# Patient Record
Sex: Female | Born: 1970 | Race: White | Hispanic: No | Smoking: Former smoker
Health system: Southern US, Community
[De-identification: ages and names within clinical notes are randomized; demographics above are authoritative.]

## PROBLEM LIST (undated history)

## (undated) DIAGNOSIS — T8859XA Other complications of anesthesia, initial encounter: Secondary | ICD-10-CM

## (undated) DIAGNOSIS — E119 Type 2 diabetes mellitus without complications: Secondary | ICD-10-CM

## (undated) DIAGNOSIS — Z9889 Other specified postprocedural states: Secondary | ICD-10-CM

## (undated) DIAGNOSIS — R51 Headache: Secondary | ICD-10-CM

## (undated) DIAGNOSIS — T4145XA Adverse effect of unspecified anesthetic, initial encounter: Secondary | ICD-10-CM

## (undated) DIAGNOSIS — I10 Essential (primary) hypertension: Secondary | ICD-10-CM

## (undated) DIAGNOSIS — Z973 Presence of spectacles and contact lenses: Secondary | ICD-10-CM

## (undated) DIAGNOSIS — Z8489 Family history of other specified conditions: Secondary | ICD-10-CM

## (undated) DIAGNOSIS — G4730 Sleep apnea, unspecified: Secondary | ICD-10-CM

## (undated) DIAGNOSIS — F419 Anxiety disorder, unspecified: Secondary | ICD-10-CM

## (undated) DIAGNOSIS — K219 Gastro-esophageal reflux disease without esophagitis: Secondary | ICD-10-CM

## (undated) DIAGNOSIS — R519 Headache, unspecified: Secondary | ICD-10-CM

## (undated) DIAGNOSIS — G43909 Migraine, unspecified, not intractable, without status migrainosus: Secondary | ICD-10-CM

## (undated) DIAGNOSIS — R112 Nausea with vomiting, unspecified: Secondary | ICD-10-CM

## (undated) DIAGNOSIS — M549 Dorsalgia, unspecified: Secondary | ICD-10-CM

## (undated) HISTORY — PX: TUBAL LIGATION: SHX77

## (undated) HISTORY — PX: CYSTOSCOPY: SUR368

## (undated) HISTORY — PX: CYSTECTOMY: SUR359

## (undated) HISTORY — DX: Anxiety disorder, unspecified: F41.9

## (undated) HISTORY — PX: CARDIAC CATHETERIZATION: SHX172

## (undated) HISTORY — DX: Dorsalgia, unspecified: M54.9

## (undated) HISTORY — DX: Essential (primary) hypertension: I10

---

## 2004-08-25 ENCOUNTER — Ambulatory Visit: Payer: Self-pay | Admitting: Unknown Physician Specialty

## 2004-08-27 ENCOUNTER — Ambulatory Visit: Payer: Self-pay | Admitting: Unknown Physician Specialty

## 2007-08-17 ENCOUNTER — Ambulatory Visit: Payer: Self-pay

## 2012-03-02 ENCOUNTER — Ambulatory Visit: Payer: Self-pay

## 2012-03-07 ENCOUNTER — Ambulatory Visit: Payer: Self-pay | Admitting: Family Medicine

## 2013-04-10 ENCOUNTER — Ambulatory Visit: Payer: Self-pay | Admitting: Obstetrics and Gynecology

## 2014-05-08 ENCOUNTER — Ambulatory Visit: Payer: Self-pay | Admitting: Obstetrics and Gynecology

## 2014-09-26 DIAGNOSIS — R0789 Other chest pain: Secondary | ICD-10-CM | POA: Insufficient documentation

## 2014-10-18 ENCOUNTER — Ambulatory Visit: Payer: Self-pay | Admitting: Cardiology

## 2014-10-18 LAB — HCG, QUANTITATIVE, PREGNANCY: Beta Hcg, Quant.: <1 m[IU]/mL

## 2014-11-29 ENCOUNTER — Other Ambulatory Visit: Payer: Self-pay | Admitting: Orthopedic Surgery

## 2014-11-29 DIAGNOSIS — M5412 Radiculopathy, cervical region: Secondary | ICD-10-CM

## 2014-12-11 ENCOUNTER — Ambulatory Visit
Admission: RE | Admit: 2014-12-11 | Discharge: 2014-12-11 | Disposition: A | Discharge status: Home / Self Care | Payer: BLUE CROSS/BLUE SHIELD | Source: Ambulatory Visit | Attending: Orthopedic Surgery | Admitting: Orthopedic Surgery

## 2014-12-11 DIAGNOSIS — M5412 Radiculopathy, cervical region: Secondary | ICD-10-CM | POA: Diagnosis present

## 2014-12-11 DIAGNOSIS — M4722 Other spondylosis with radiculopathy, cervical region: Secondary | ICD-10-CM | POA: Insufficient documentation

## 2014-12-12 ENCOUNTER — Ambulatory Visit (INDEPENDENT_AMBULATORY_CARE_PROVIDER_SITE_OTHER): Payer: BLUE CROSS/BLUE SHIELD

## 2014-12-12 ENCOUNTER — Ambulatory Visit (INDEPENDENT_AMBULATORY_CARE_PROVIDER_SITE_OTHER): Payer: BLUE CROSS/BLUE SHIELD | Admitting: Podiatry

## 2014-12-12 ENCOUNTER — Encounter: Payer: Self-pay | Admitting: Podiatry

## 2014-12-12 ENCOUNTER — Other Ambulatory Visit: Payer: Self-pay | Admitting: *Deleted

## 2014-12-12 VITALS — BP 124/88 | HR 102 | Resp 16 | Ht 67.0 in | Wt 200.0 lb

## 2014-12-12 DIAGNOSIS — M722 Plantar fascial fibromatosis: Secondary | ICD-10-CM

## 2014-12-12 DIAGNOSIS — E669 Obesity, unspecified: Secondary | ICD-10-CM | POA: Insufficient documentation

## 2014-12-12 DIAGNOSIS — E559 Vitamin D deficiency, unspecified: Secondary | ICD-10-CM | POA: Insufficient documentation

## 2014-12-12 DIAGNOSIS — F419 Anxiety disorder, unspecified: Secondary | ICD-10-CM | POA: Insufficient documentation

## 2014-12-12 DIAGNOSIS — R7303 Prediabetes: Secondary | ICD-10-CM | POA: Insufficient documentation

## 2014-12-12 MED ORDER — TRIAMCINOLONE ACETONIDE 10 MG/ML IJ SUSP
10.0000 mg | Freq: Once | INTRAMUSCULAR | Status: AC
  Administered 2014-12-12: 10 mg

## 2014-12-12 NOTE — Patient Instructions (Signed)

## 2014-12-12 NOTE — Progress Notes (Signed)
Subjective:     Patient ID: Julia Nunez, female   DOB: 1970/11/11, 44 y.o.   MRN: [id number]  HPI 44 year old female presents the office today with complaints of bilateral heel pain, plantar fasciitis. She states that she has been diagnosed with plantar fasciitis for which she was previous he seen another podiatrist for several years ago. She states that she's had pain in her heel for approximate 5 years. She states that she's had undergone a series of injections, orthotics, stretching, bracing. She states that after the injections and treatment she does have resolution however after. Time the pain discomfort back. She states that her last time she is seen today for this was approximate one year ago. She states that she has pain to her heels been taken the morning or after periods of inactivity which is relieved by ambulation. She denies any numbness or tingling. Denies E swelling or redness. Denies any history of injury or trauma or any change or increase activity the time of onset of symptoms. No other complaints at this time.   Review of Systems  All other systems reviewed and are negative.      Objective:   Physical Exam AAO x3, NAD DP/PT pulses palpable bilaterally, CRT less than 3 seconds Protective sensation intact with Simms Weinstein monofilament, vibratory sensation intact, Achilles tendon reflex intact Tenderness to palpation overlying the plantar medial tubercle of the calcaneus to bilateral heels at the insertion of the plantar fascia. There is no pain along the course of plantar fascia within the arch of the foot. There is no pain with lateral compression of the calcaneus or pain the vibratory sensation. No pain on the posterior aspect of the calcaneus or along the course/insertion of the Achilles tendon. There is no overlying edema, erythema, increase in warmth. No other areas of tenderness palpation or pain with vibratory sensation to the foot/ankle. MMT 5/5, ROM WNL No  open lesions or pre-ulcerative lesions are identified. No pain with calf compression, swelling, warmth, erythema.     Assessment:     44 year old female with bilateral heel pain, plantar fasciitis.     Plan:     -X-rays were obtained and reviewed with the patient. -Treatment options were discussed including alternatives, risks, complications. -Patient elects to proceed with steroid injection into the bilateral heels. Under sterile skin preparation, a total of 2.5cc of kenalog 10, 0.5% Marcaine plain, and 2% lidocaine plain were infiltrated into the symptomatic area without complication. A band-aid was applied. Patient tolerated the injection well without complication. Post-injection care with discussed with the patient. Discussed with the patient to ice the area over the next couple of days to help prevent a steroid flare.  -Dispensed bilateral plantar fascial braces -Night splint -Ice to the area -Stretching exercises -Discussed shoe gear modifications and to wear supportive shoe at all times. Recommended not to go barefoot even while at home. Discussed orthotics. -Follow-up in 3 weeks or sooner if any problems are to arise. Call with any questions or concerns or any change in symptoms in the meantime.

## 2014-12-18 ENCOUNTER — Other Ambulatory Visit: Payer: Self-pay | Admitting: Obstetrics and Gynecology

## 2014-12-18 DIAGNOSIS — Z1231 Encounter for screening mammogram for malignant neoplasm of breast: Secondary | ICD-10-CM

## 2014-12-19 LAB — HM PAP SMEAR: HM Pap smear: NEGATIVE

## 2014-12-25 ENCOUNTER — Ambulatory Visit: Payer: Self-pay | Admitting: Surgery

## 2015-01-01 ENCOUNTER — Ambulatory Visit (INDEPENDENT_AMBULATORY_CARE_PROVIDER_SITE_OTHER): Payer: BLUE CROSS/BLUE SHIELD | Admitting: Surgery

## 2015-01-01 ENCOUNTER — Encounter: Payer: Self-pay | Admitting: Surgery

## 2015-01-01 VITALS — BP 137/85 | HR 87 | Temp 98.5°F | Resp 20 | Ht 67.0 in | Wt 216.0 lb

## 2015-01-01 DIAGNOSIS — L723 Sebaceous cyst: Secondary | ICD-10-CM | POA: Diagnosis not present

## 2015-01-01 MED ORDER — OXYCODONE-ACETAMINOPHEN 5-325 MG PO TABS: 1.0000 | ORAL_TABLET | Freq: Three times a day (TID) | ORAL | Status: DC | PRN

## 2015-01-01 MED ORDER — HYDROCODONE-ACETAMINOPHEN 5-325 MG PO TABS: 1.0000 | ORAL_TABLET | Freq: Three times a day (TID) | ORAL | Status: DC | PRN

## 2015-01-01 NOTE — Patient Instructions (Signed)
Call if you have increased pain, redness/swelling from sebaceous cysts

## 2015-01-01 NOTE — Progress Notes (Signed)
44 yo with history of multiple sebaceous cysts on scalp which have been excised in past who presents with multiple, tender sebaceous cysts.  All chronic.  No redness/drainage.  No fluctuance.  No fevers/chills, night sweats, shortness of breath, chest pain, cough, abdominal pain, nausea/vomiting, diarrhea/constipation, dysuria/hematuria.  Active Ambulatory Problems    Diagnosis Date Noted  . Anxiety 12/12/2014  . Atypical chest pain 09/26/2014  . Borderline diabetes 12/12/2014  . Adiposity 12/12/2014  . Avitaminosis D 12/12/2014  . Sebaceous cyst 01/01/2015   Resolved Ambulatory Problems    Diagnosis Date Noted  . No Resolved Ambulatory Problems   Past Medical History  Diagnosis Date  . Hypertension   . Back pain    History   Social History  . Marital Status: Single    Spouse Name: N/A  . Number of Children: N/A  . Years of Education: N/A   Occupational History  . Not on file.   Social History Main Topics  . Smoking status: Former Research scientist (life sciences)  . Smokeless tobacco: Never Used  . Alcohol Use: 0.0 oz/week    0 Standard drinks or equivalent per week     Comment: OCCASIONALLY  . Drug Use: No  . Sexual Activity: Not on file   Other Topics Concern  . Not on file   Social History Narrative   Allergies  Allergen Reactions  . Hydrocodone-Acetaminophen Nausea Only   Family History  Problem Relation Age of Onset  . Hypertension Father   . Diabetes Father    Blood pressure 137/85, pulse 87, temperature 98.5 F (36.9 C), temperature source Oral, resp. rate 20, height 5\' 7"  (1.702 m), weight 216 lb (97.977 kg), last menstrual period 12/25/2014. GEN: NAD/A&Ox3 FACE: no obvious facial trauma, normal external nose, normal external ears EYES: no scleral icterus, no conjunctivitis HEAD: normocephalic atraumatic, multiple subcentimeter sebaceous cysts on scalp without erythema/fluctuance, drainage CV: RRR, no MRG RESP: moving air well, lungs clear ABD: soft, nontender,  nondistended EXT: moving all ext well, strength 5/5 NEURO: cnII-XII grossly intact, sensation intact all 4 ext  Labs: None  Imaging: None  A/P  44 yo with multiple sebaceous cysts, will plan on excision on 6/22.  Given Rx for percocet to take before procedure.  I have spoken with patient and she would like to proceed as planned.

## 2015-01-02 ENCOUNTER — Encounter: Payer: Self-pay | Admitting: Podiatry

## 2015-01-02 ENCOUNTER — Ambulatory Visit (INDEPENDENT_AMBULATORY_CARE_PROVIDER_SITE_OTHER): Payer: BLUE CROSS/BLUE SHIELD | Admitting: Podiatry

## 2015-01-02 VITALS — BP 112/80 | HR 92 | Resp 16

## 2015-01-02 DIAGNOSIS — M722 Plantar fascial fibromatosis: Secondary | ICD-10-CM

## 2015-01-02 MED ORDER — TRIAMCINOLONE ACETONIDE 10 MG/ML IJ SUSP: 10.0000 mg | Freq: Once | INTRAMUSCULAR | Status: DC

## 2015-01-07 NOTE — Progress Notes (Signed)
Patient ID: Julia Nunez, female   DOB: 1971-02-20, 44 y.o.   MRN: [id number]  Subjective: 44 year old female presents the office they for follow-up evaluation of bilateral heel pain, plantar fasciitis. She states that overall she is doing better than what she was compared to last appointment all as she does continue to have some discomfort to her heel. She's been continuing with the stretching, icing activities on a consistent basis as well as wearing the plantar fascial braces. She had no complications after the injection last appointment. She denies any numbness or tingling in the pain does not wake her up at night. Denies any swelling or redness. No acute changes since last appointment and no other complaints at this time.  Objective: AAO x3, NAD DP/PT pulses palpable bilaterally, CRT less than 3 seconds Protective sensation intact with Simms Weinstein monofilament, vibratory sensation intact, Achilles tendon reflex intact There is continued mild tenderness to palpation overlying the plantar medial tubercle of the calcaneus to bilateral heels at the insertion of the plantar fascia. There is no pain along the course of plantar fascial within the arch of the foot and the plantar fascia appears intact. There is no pain with lateral compression of the calcaneus or pain the vibratory sensation. No pain on the posterior aspect of the calcaneus or along the course/insertion of the Achilles tendon. There is no overlying edema, erythema, increase in warmth. No other areas of tenderness palpation or pain with vibratory sensation to the foot/ankle. MMT 5/5, ROM WNL No open lesions or pre-ulcerative lesions are identified. No pain with calf compression, swelling, warmth, erythema.  Assessment: 44 year old female with bilateral heel pain, plantar fasciitis; improving  Plan: -Treatment options discussed including all alternatives, risks, and complications Patient elects to proceed with steroid injection  into the bilateral heels. Under sterile skin preparation, a total of 2.5cc of kenalog 10, 0.5% Marcaine plain, and 2% lidocaine plain were infiltrated into the symptomatic area without complication. A band-aid was applied. Patient tolerated the injection well without complication. Post-injection care with discussed with the patient. Discussed with the patient to ice the area over the next couple of days to help prevent a steroid flare.  -Continue plantar fascial brace. -Ice to the area -Stretching activities on a consistent basis. -Shoe gear modifications and discuss orthotics again. -Follow-up in 3 weeks or sooner if any problems are to arise. In the meantime I encouraged her to call the office with any questions, concerns, change in symptoms.

## 2015-01-15 ENCOUNTER — Ambulatory Visit: Payer: Self-pay | Admitting: Surgery

## 2015-01-23 ENCOUNTER — Encounter: Payer: Self-pay | Admitting: Podiatry

## 2015-01-23 ENCOUNTER — Ambulatory Visit (INDEPENDENT_AMBULATORY_CARE_PROVIDER_SITE_OTHER): Payer: BLUE CROSS/BLUE SHIELD | Admitting: Podiatry

## 2015-01-23 VITALS — BP 121/79 | HR 97 | Resp 18

## 2015-01-23 DIAGNOSIS — M722 Plantar fascial fibromatosis: Secondary | ICD-10-CM | POA: Diagnosis not present

## 2015-01-23 NOTE — Progress Notes (Signed)
Patient ID: Julia Nunez, female   DOB: [birth year], 44 y.o.   MRN: [id number]  Subjective: 44 year old female presents the office they for follow-up evaluation of bilateral heel pain, plantar fasciitis. She states his last appointment she is tentatively improved compared to what she was a last appointment. She has continued some mild intermittent discomfort to the bottom of her heel however is not on a consistent basis. She denies a numbness routinely denies any swelling or redness. The pain does not wake her up at night. She'll continue with stretching, icing to become a consistent basis as well as the night splint and the plantar fascial brace. No other complaints at this time in no acute changes since last appointment. Denies any systemic complaints as fevers, chills, nausea, vomiting.  Objective: AAO x3, NAD DP/PT pulses palpable bilaterally, CRT less than 3 seconds Protective sensation intact with Simms Weinstein monofilament, vibratory sensation intact, Achilles tendon reflex intact There is significantly decreased tenderness to palpation overlying the plantar medial tubercle of the calcaneus to bilateral heels at the insertion of the plantar fascia. There is no pain along the course of plantar fascia within the arch of the foot. And the plantar fascia appears intact There is no pain with lateral compression of the calcaneus or pain the vibratory sensation. No pain on the posterior aspect of the calcaneus or along the course/insertion of the Achilles tendon. There is no overlying edema, erythema, increase in warmth. No other areas of tenderness palpation or pain with vibratory sensation to the foot/ankle. MMT 5/5, ROM WNL No open lesions or pre-ulcerative lesions are identified. No pain with calf compression, swelling, warmth, erythema.   Assessment: 44 year old female with resolving bilateral plantar fasciitis  Plan: -Treatment options discussed including all alternatives, risks, and  complications -Will hold off on a third steroid injection as her pain is substantially improved.  -Discussed CMO and she would like to proceed with this. She was scanned for orthotics and they were sent to [facility] -Continue stretching and icing on a consistent basis.  -Discussed shoegear modifications -Follow-up 4 weeks or sooner if any problems arise. In the meantime, encouraged to call the office with any questions, concerns, change in symptoms.  -Orthotic pickup next appt.   Celesta Gentile, DPM

## 2015-02-05 ENCOUNTER — Ambulatory Visit (INDEPENDENT_AMBULATORY_CARE_PROVIDER_SITE_OTHER): Payer: BLUE CROSS/BLUE SHIELD | Admitting: Surgery

## 2015-02-05 ENCOUNTER — Encounter: Payer: Self-pay | Admitting: Surgery

## 2015-02-05 VITALS — BP 131/90 | HR 96 | Temp 99.4°F | Resp 20

## 2015-02-05 DIAGNOSIS — L723 Sebaceous cyst: Secondary | ICD-10-CM

## 2015-02-06 NOTE — Progress Notes (Signed)
CC: Multiple sebaceous cyst  HPI: 44 yo F who presents for follow up for excision of multiple scalp sebaceous cysts.  Have seen her previously.  Are a nuisance more than anything.  No redness/tenderness.  No fevers/chills, night sweats, shortness of breath, cough, chest pain, nausea/vomiting, diarrhea/constipation, dysuria/hematuria.  Active Ambulatory Problems    Diagnosis Date Noted  . Anxiety 12/12/2014  . Atypical chest pain 09/26/2014  . Borderline diabetes 12/12/2014  . Adiposity 12/12/2014  . Avitaminosis D 12/12/2014  . Sebaceous cyst 01/01/2015   Resolved Ambulatory Problems    Diagnosis Date Noted  . No Resolved Ambulatory Problems   Past Medical History  Diagnosis Date  . Hypertension   . Back pain    Family History  Problem Relation Age of Onset  . Hypertension Father   . Diabetes Father    History   Social History  . Marital Status: Single    Spouse Name: N/A  . Number of Children: N/A  . Years of Education: N/A   Occupational History  . Not on file.   Social History Main Topics  . Smoking status: Former Research scientist (life sciences)  . Smokeless tobacco: Never Used  . Alcohol Use: 0.0 oz/week    0 Standard drinks or equivalent per week     Comment: OCCASIONALLY  . Drug Use: No  . Sexual Activity: Not on file   Other Topics Concern  . Not on file   Social History Narrative     Medication List       This list is accurate as of: 02/05/15 11:59 PM.  Always use your most recent med list.               ALPRAZolam 0.5 MG tablet  Commonly known as:  XANAX     buPROPion 150 MG 24 hr tablet  Commonly known as:  WELLBUTRIN XL     ciprofloxacin 500 MG tablet  Commonly known as:  CIPRO     fluconazole 150 MG tablet  Commonly known as:  DIFLUCAN     hydrochlorothiazide 25 MG tablet  Commonly known as:  HYDRODIURIL     meloxicam 15 MG tablet  Commonly known as:  MOBIC     methocarbamol 500 MG tablet  Commonly known as:  ROBAXIN  Take by mouth.     NEXIUM 40  MG capsule  Generic drug:  esomeprazole  Take by mouth.     oxyCODONE 5 MG immediate release tablet  Commonly known as:  Oxy IR/ROXICODONE  Take by mouth.     oxyCODONE-acetaminophen 5-325 MG per tablet  Commonly known as:  PERCOCET/ROXICET  Take 1 tablet by mouth every 8 (eight) hours as needed for severe pain. Take 1-2 tabs prior to procedure on 6/22     polyethylene glycol powder powder  Commonly known as:  GLYCOLAX/MIRALAX     predniSONE 10 MG tablet  Commonly known as:  DELTASONE     tiZANidine 4 MG tablet  Commonly known as:  ZANAFLEX     Vitamin D (Ergocalciferol) 50000 UNITS Caps capsule  Commonly known as:  DRISDOL     zolpidem 12.5 MG CR tablet  Commonly known as:  AMBIEN CR       ROS: Full ROS obtained, pertinent positives and negatives as above  Blood pressure 131/90, pulse 96, temperature 99.4 F (37.4 C), temperature source Oral, resp. rate 20. GEN:NAD/A&Ox3 SCALP: multiple sebaceous cysts, largest approx 1cm diameter, on scalp  Imaging: None Labs: None  Procedure performed: Informed consent obtained.  Scalp area with sebaceous cyst shaved, prepped and draped.  Timeout performed.  Pericystic area infiltrated with 1% lidocaine with epinephrine.  Longitudinal incision made down to cyst.  Cyst dissected out en toto.  Wound irrigated and closed with simple 4-0 vicryl interrupted suture.  Procedure repeated for 4 additional cysts.  After all excised, patient drapes taken down.  No immediate complications.  Needle, sponge and instrument count correct at end of procedure.

## 2015-02-06 NOTE — Patient Instructions (Signed)
Do not drive on pain medications °Call or return to ER if you develop fever greater than 101.5, nausea/vomiting, increased pain, redness/drainage from incisions ° ° °

## 2015-02-12 ENCOUNTER — Ambulatory Visit: Payer: BLUE CROSS/BLUE SHIELD | Admitting: Surgery

## 2015-02-12 ENCOUNTER — Encounter: Payer: Self-pay | Admitting: Surgery

## 2015-02-12 ENCOUNTER — Ambulatory Visit (INDEPENDENT_AMBULATORY_CARE_PROVIDER_SITE_OTHER): Payer: BLUE CROSS/BLUE SHIELD | Admitting: Surgery

## 2015-02-12 VITALS — BP 130/81 | HR 97 | Temp 98.5°F | Ht 67.0 in | Wt 217.0 lb

## 2015-02-12 DIAGNOSIS — L723 Sebaceous cyst: Secondary | ICD-10-CM

## 2015-02-12 NOTE — Patient Instructions (Signed)
Please give [place] a call if you need [place].

## 2015-02-13 NOTE — Progress Notes (Signed)
She is here today for suture removal. Sutures were removed. All 5 incision sites on her scalp are healing nicely.  See her back in the office as needed.

## 2015-02-20 ENCOUNTER — Ambulatory Visit (INDEPENDENT_AMBULATORY_CARE_PROVIDER_SITE_OTHER): Payer: BLUE CROSS/BLUE SHIELD | Admitting: Podiatry

## 2015-02-20 ENCOUNTER — Encounter: Payer: Self-pay | Admitting: Podiatry

## 2015-02-20 DIAGNOSIS — M722 Plantar fascial fibromatosis: Secondary | ICD-10-CM | POA: Diagnosis not present

## 2015-02-21 NOTE — Progress Notes (Signed)
Patient ID: Julia Nunez, female   DOB: 1970/09/12, 44 y.o.   MRN: [id number]  Subjective: 44 year old female presents the office they for follow-up evaluation of bilateral heel pain, plantar fasciitis and to pick up her inserts.  She has continue with stretching, icing activities as well as the plantar fascial braces. She states that her pain significantly improved compared to what it was previously. She does get some mild intermittent discomfort at times however is not significant. Denies any swelling or redness. Denies numbness or tingling. No other complaints at this time.  Objective: AAO x3, NAD DP/PT pulses palpable bilaterally, CRT less than 3 seconds Protective sensation intact with Simms Weinstein monofilament, vibratory sensation intact, Achilles tendon reflex intact There is slight tenderness to palpation overlying the plantar medial tubercle of the calcaneus to bilateral heels at the insertion of the plantar fascia although it is greatly decreased. There is no pain along the course of plantar fascia within the arch of the foot. And the plantar fascia appears intact There is no pain with lateral compression of the calcaneus or pain the vibratory sensation. No pain on the posterior aspect of the calcaneus or along the course/insertion of the Achilles tendon. There is no overlying edema, erythema, increase in warmth. No other areas of tenderness palpation or pain with vibratory sensation to the foot/ankle. MMT 5/5, ROM WNL No open lesions or pre-ulcerative lesions are identified. No pain with calf compression, swelling, warmth, erythema.   Assessment: 44 year old female with resolving bilateral plantar fasciitis  Plan: -Treatment options discussed including all alternatives, risks, and complications - At today's appointment she picked up orthotics. She tried them on and they were trimmed to fit into her shoe. Break in instructions were discussed the patient. -Continue stretching and  icing on a consistent basis.  -Discussed shoegear modifications -Follow-up 4 weeks or sooner if any problems arise. In the meantime, encouraged to call the office with any questions, concerns, change in symptoms.   Celesta Gentile, DPM

## 2015-03-18 ENCOUNTER — Encounter: Payer: Self-pay | Admitting: Podiatry

## 2015-03-18 ENCOUNTER — Ambulatory Visit (INDEPENDENT_AMBULATORY_CARE_PROVIDER_SITE_OTHER): Payer: BLUE CROSS/BLUE SHIELD | Admitting: Podiatry

## 2015-03-18 VITALS — BP 130/91 | HR 101 | Resp 18

## 2015-03-18 DIAGNOSIS — M722 Plantar fascial fibromatosis: Secondary | ICD-10-CM

## 2015-03-19 NOTE — Progress Notes (Signed)
Patient ID: Julia Nunez, female   DOB: 02/17/1971, 44 y.o.   MRN: [id number]  Subjective: 44 year old female presents the office today for follow-up evaluation after receiving orthotics in for plantar fasciitis. She states that she has gotten  Adjusted to the orthotics and she is not wearing them all and time. She states that she notices significant difference when she does  Wear them. She has occasional discomfort to her heels although she states it is significantly improved. No other complaints at this time. No acute changes.  Objective: AAO x3, NAD DP/PT pulses palpable bilaterally, CRT less than 3 seconds Protective sensation intact with Simms Weinstein monofilament, vibratory sensation intact, Achilles tendon reflex intact There is currently no tenderness to palpation overlying the plantar medial tubercle of the calcaneus to bilateral heel at the insertion of the plantar fascia. There is no pain along the course of plantar fascia within the arch of the foot. There is no pain with lateral compression of the calcaneus or pain the vibratory sensation. No pain on the posterior aspect of the calcaneus or along the course/insertion of the Achilles tendon. There is no overlying edema, erythema, increase in warmth. No other areas of tenderness palpation or pain with vibratory sensation to the foot/ankle.  No open lesions or pre-ulcerative lesions are identified. No pain with calf compression, swelling, warmth, erythema.  Assessment:  44 year old female with resolving heel pain , plantar fasciitis   Plan: -Treatment options discussed including all alternatives, risks, and complications -Recommended continue with stretching, icing activities on a consistent basis until she has no pain. Also continue with orthotics. Continue wear supportive shoe at all times leaving the house. Follow-up in 4 weeks if his symptoms are not completely resolved or sooner if any problems are to arise. In the meantime I  encouraged her to call the office with any questions, concerns, change in symptoms.  Celesta Gentile, DPM

## 2015-03-27 ENCOUNTER — Encounter: Payer: Self-pay | Admitting: *Deleted

## 2015-03-27 ENCOUNTER — Ambulatory Visit: Payer: BLUE CROSS/BLUE SHIELD | Admitting: Certified Registered Nurse Anesthetist

## 2015-03-27 ENCOUNTER — Ambulatory Visit
Admission: RE | Admit: 2015-03-27 | Discharge: 2015-03-27 | Disposition: A | Discharge status: Home / Self Care | Payer: BLUE CROSS/BLUE SHIELD | Source: Ambulatory Visit | Attending: Unknown Physician Specialty | Admitting: Unknown Physician Specialty

## 2015-03-27 ENCOUNTER — Encounter
Admission: RE | Disposition: A | Discharge status: Home / Self Care | Payer: Self-pay | Source: Ambulatory Visit | Attending: Unknown Physician Specialty

## 2015-03-27 DIAGNOSIS — K640 First degree hemorrhoids: Secondary | ICD-10-CM | POA: Diagnosis not present

## 2015-03-27 DIAGNOSIS — K317 Polyp of stomach and duodenum: Secondary | ICD-10-CM | POA: Insufficient documentation

## 2015-03-27 DIAGNOSIS — Z86010 Personal history of colonic polyps: Secondary | ICD-10-CM | POA: Insufficient documentation

## 2015-03-27 DIAGNOSIS — I252 Old myocardial infarction: Secondary | ICD-10-CM | POA: Diagnosis not present

## 2015-03-27 DIAGNOSIS — Z09 Encounter for follow-up examination after completed treatment for conditions other than malignant neoplasm: Secondary | ICD-10-CM | POA: Diagnosis present

## 2015-03-27 DIAGNOSIS — Z886 Allergy status to analgesic agent status: Secondary | ICD-10-CM | POA: Insufficient documentation

## 2015-03-27 DIAGNOSIS — I2510 Atherosclerotic heart disease of native coronary artery without angina pectoris: Secondary | ICD-10-CM | POA: Insufficient documentation

## 2015-03-27 DIAGNOSIS — D127 Benign neoplasm of rectosigmoid junction: Secondary | ICD-10-CM | POA: Insufficient documentation

## 2015-03-27 DIAGNOSIS — Z79899 Other long term (current) drug therapy: Secondary | ICD-10-CM | POA: Insufficient documentation

## 2015-03-27 DIAGNOSIS — K219 Gastro-esophageal reflux disease without esophagitis: Secondary | ICD-10-CM | POA: Diagnosis not present

## 2015-03-27 DIAGNOSIS — F419 Anxiety disorder, unspecified: Secondary | ICD-10-CM | POA: Insufficient documentation

## 2015-03-27 DIAGNOSIS — D125 Benign neoplasm of sigmoid colon: Secondary | ICD-10-CM | POA: Insufficient documentation

## 2015-03-27 DIAGNOSIS — Z87891 Personal history of nicotine dependence: Secondary | ICD-10-CM | POA: Insufficient documentation

## 2015-03-27 DIAGNOSIS — I10 Essential (primary) hypertension: Secondary | ICD-10-CM | POA: Insufficient documentation

## 2015-03-27 HISTORY — PX: COLONOSCOPY WITH PROPOFOL: SHX5780

## 2015-03-27 HISTORY — PX: SAVORY DILATION: SHX5439

## 2015-03-27 HISTORY — PX: ESOPHAGOGASTRODUODENOSCOPY: SHX5428

## 2015-03-27 SURGERY — EGD (ESOPHAGOGASTRODUODENOSCOPY)
Anesthesia: General

## 2015-03-27 MED ORDER — MIDAZOLAM HCL 5 MG/5ML IJ SOLN
INTRAMUSCULAR | Status: DC | PRN
  Administered 2015-03-27: 1 mg via INTRAVENOUS

## 2015-03-27 MED ORDER — FENTANYL CITRATE (PF) 100 MCG/2ML IJ SOLN
INTRAMUSCULAR | Status: DC | PRN
Start: 2015-03-27 — End: 2015-03-27
  Administered 2015-03-27: 50 ug via INTRAVENOUS

## 2015-03-27 MED ORDER — PROPOFOL 10 MG/ML IV BOLUS
INTRAVENOUS | Status: DC | PRN
  Administered 2015-03-27: 50 mg via INTRAVENOUS

## 2015-03-27 MED ORDER — SODIUM CHLORIDE 0.9 % IV SOLN: INTRAVENOUS | Status: DC

## 2015-03-27 MED ORDER — PROPOFOL INFUSION 10 MG/ML OPTIME
INTRAVENOUS | Status: DC | PRN
  Administered 2015-03-27: 180 ug/kg/min via INTRAVENOUS

## 2015-03-27 MED ORDER — SODIUM CHLORIDE 0.9 % IV SOLN
INTRAVENOUS | Status: DC
  Administered 2015-03-27: 1000 mL via INTRAVENOUS

## 2015-03-27 MED ORDER — LIDOCAINE HCL (CARDIAC) 20 MG/ML IV SOLN
INTRAVENOUS | Status: DC | PRN
  Administered 2015-03-27: 100 mg via INTRAVENOUS

## 2015-03-27 MED ORDER — GLYCOPYRROLATE 0.2 MG/ML IJ SOLN
INTRAMUSCULAR | Status: DC | PRN
  Administered 2015-03-27: 0.3 mg via INTRAVENOUS

## 2015-03-27 NOTE — H&P (Signed)
Primary Care Physician:  [facility], MD Primary Gastroenterologist:  Dr. Vira Agar  Pre-Procedure History & Physical: HPI:  Julia Nunez is a 44 y.o. female is here for a colonoscopy and upper endoscopy    Past Medical History  Diagnosis Date  . Anxiety   . Hypertension   . Back pain     Past Surgical History  Procedure Laterality Date  . Cesarean section  1999    X1  . Cystectomy      SEBACEOUS CYST ON SCALP    Prior to Admission medications   Medication Sig Start Date End Date Taking? Authorizing Provider  ALPRAZolam Duanne Moron) 0.5 MG tablet  12/09/14  Yes Historical Provider, MD  buPROPion (WELLBUTRIN XL) 150 MG 24 hr tablet  11/07/14  Yes Historical Provider, MD  clobetasol cream (TEMOVATE) 0.05 %  02/24/15  Yes Historical Provider, MD  esomeprazole (NEXIUM) 40 MG capsule Take by mouth. 01/22/15  Yes Historical Provider, MD  hydrochlorothiazide (HYDRODIURIL) 25 MG tablet  10/30/14  Yes Historical Provider, MD  ciprofloxacin (CIPRO) 500 MG tablet  12/03/14   Historical Provider, MD  fluconazole (DIFLUCAN) 150 MG tablet  12/03/14   Historical Provider, MD  meloxicam (MOBIC) 15 MG tablet Take by mouth. 02/17/15   Historical Provider, MD  methocarbamol (ROBAXIN) 500 MG tablet Take by mouth. 01/15/15   Historical Provider, MD  oxyCODONE (OXY IR/ROXICODONE) 5 MG immediate release tablet Take by mouth. 01/30/15   Historical Provider, MD  oxyCODONE-acetaminophen (PERCOCET/ROXICET) 5-325 MG per tablet Take 1 tablet by mouth every 8 (eight) hours as needed for severe pain. Take 1-2 tabs prior to procedure on 6/22 Patient not taking: Reported on 03/27/2015 01/01/15   Marlyce Huge, MD  polyethylene glycol powder (GLYCOLAX/MIRALAX) powder  01/22/15   Historical Provider, MD  predniSONE (DELTASONE) 10 MG tablet  10/04/14   Historical Provider, MD  tiZANidine (ZANAFLEX) 4 MG tablet  09/18/14   Historical Provider, MD  Vitamin D, Ergocalciferol, (DRISDOL) 50000 UNITS CAPS capsule  12/09/14    Historical Provider, MD  zolpidem (AMBIEN CR) 12.5 MG CR tablet  11/07/14   Historical Provider, MD    Allergies as of 01/23/2015 - Review Complete 01/23/2015  Allergen Reaction Noted  . Hydrocodone-acetaminophen Nausea Only 12/12/2014    Family History  Problem Relation Age of Onset  . Hypertension Father   . Diabetes Father     Social History   Social History  . Marital Status: Single    Spouse Name: N/A  . Number of Children: N/A  . Years of Education: N/A   Occupational History  . Not on file.   Social History Main Topics  . Smoking status: Former Research scientist (life sciences)  . Smokeless tobacco: Never Used  . Alcohol Use: 0.0 oz/week    0 Standard drinks or equivalent per week     Comment: OCCASIONALLY  . Drug Use: No  . Sexual Activity: Yes    Birth Control/ Protection: IUD   Other Topics Concern  . Not on file   Social History Narrative    Review of Systems: See HPI, otherwise negative ROS  Physical Exam: BP 118/80 mmHg  Pulse 88  Temp(Src) 98.5 F (36.9 C) (Oral)  Resp 18  Ht 5\' 7"  (1.702 m)  Wt 95.255 kg (210 lb)  BMI 32.88 kg/m2  SpO2 100%  LMP 03/09/2015 General:   Alert,  pleasant and cooperative in NAD Head:  Normocephalic and atraumatic. Neck:  Supple; no masses or thyromegaly. Lungs:  Clear throughout to auscultation.  Heart:  Regular rate and rhythm. Abdomen:  Soft, nontender and nondistended. Normal bowel sounds, without guarding, and without rebound.   Neurologic:  Alert and  oriented x4;  grossly normal neurologically.  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  Questions have been answered.  All parties agreeable.   Gaylyn Cheers, MD  03/27/2015, 1:32 PM

## 2015-03-27 NOTE — Op Note (Signed)
[facility] Gastroenterology Patient Name: Julia Nunez Procedure Date: 03/27/2015 12:55 PM MRN: [id number] Account #: [id number] Date of Birth: [birth year] Admit Type: Outpatient Age: 44 Room: [facility] ENDO ROOM 1 Gender: Female Note Status: Finalized Procedure:         Colonoscopy Indications:       Personal history of colonic polyps Providers:         Manya Silvas, MD Referring MD:      Sofie Hartigan (Referring MD) Medicines:         Propofol per Anesthesia Complications:     No immediate complications. Procedure:         Pre-Anesthesia Assessment:                    - After reviewing the risks and benefits, the patient was                     deemed in satisfactory condition to undergo the procedure.                    After obtaining informed consent, the colonoscope was                     passed under direct vision. Throughout the procedure, the                     patient's blood pressure, pulse, and oxygen saturations                     were monitored continuously. The Colonoscope was                     introduced through the anus and advanced to the the cecum,                     identified by appendiceal orifice and ileocecal valve. The                     colonoscopy was performed without difficulty. The patient                     tolerated the procedure well. The quality of the bowel                     preparation was excellent. Findings:      Three very small sessile polyps were found in the recto-sigmoid colon       and in the sigmoid colon. The polyps were diminutive in size. These       polyps were removed with a jumbo cold forceps. Resection and retrieval       were complete.      Internal hemorrhoids were found during endoscopy. The hemorrhoids were       small and Grade I (internal hemorrhoids that do not prolapse).      The exam was otherwise without abnormality. Impression:        - Three diminutive polyps at the recto-sigmoid  colon and                     in the sigmoid colon. Resected and retrieved.                    - Internal hemorrhoids.                    -  The examination was otherwise normal. Recommendation:    - Await pathology results. Manya Silvas, MD 03/27/2015 2:23:49 PM This report has been signed electronically. Number of Addenda: 0 Note Initiated On: 03/27/2015 12:55 PM Scope Withdrawal Time: 0 hours 7 minutes 57 seconds  Total Procedure Duration: 0 hours 20 minutes 11 seconds       [facility]

## 2015-03-27 NOTE — Anesthesia Preprocedure Evaluation (Signed)
Anesthesia Evaluation  Patient identified by MRN, date of birth, ID band Patient awake    Reviewed: Allergy & Precautions, H&P , NPO status , Patient's Chart, lab work & pertinent test results, reviewed documented beta blocker date and time   History of Anesthesia Complications Negative for: history of anesthetic complications  Airway Mallampati: I  TM Distance: >3 FB Neck ROM: full    Dental  (+) Teeth Intact   Pulmonary former smoker,  breath sounds clear to auscultation  Pulmonary exam normal       Cardiovascular Exercise Tolerance: Good hypertension, + angina at rest - CAD, - Past MI, - Cardiac Stents and - 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) Normal cardiovascular exam- dysrhythmias - Valvular Problems/MurmursRhythm:regular Rate:Normal     Neuro/Psych negative neurological ROS  negative psych ROS   GI/Hepatic Neg liver ROS, GERD-  Medicated,  Endo/Other  negative endocrine ROS  Renal/GU negative Renal ROS  negative genitourinary   Musculoskeletal   Abdominal   Peds  Hematology negative hematology ROS (+)   Anesthesia Other Findings Past Medical History:   Anxiety                                                      Hypertension                                                 Back pain                                                    Reproductive/Obstetrics negative OB ROS                             Anesthesia Physical Anesthesia Plan  ASA: II  Anesthesia Plan: General   Post-op Pain Management:    Induction:   Airway Management Planned:   Additional Equipment:   Intra-op Plan:   Post-operative Plan:   Informed Consent: I have reviewed the patients History and Physical, chart, labs and discussed the procedure including the risks, benefits and alternatives for the proposed anesthesia with the patient or authorized representative who has indicated his/her understanding and  acceptance.   Dental Advisory Given  Plan Discussed with: Anesthesiologist, CRNA and Surgeon  Anesthesia Plan Comments:         Anesthesia Quick Evaluation

## 2015-03-27 NOTE — Op Note (Signed)
[facility] Gastroenterology Patient Name: Julia Nunez Procedure Date: 03/27/2015 12:56 PM MRN: [id number] Account #: [id number] Date of Birth: November 16, 1970 Admit Type: Outpatient Age: 44 Room: [facility] ENDO ROOM 1 Gender: Female Note Status: Finalized Procedure:         Upper GI endoscopy Indications:       Heartburn Providers:         Manya Silvas, MD Referring MD:      Sofie Hartigan (Referring MD) Medicines:         Propofol per Anesthesia Complications:     No immediate complications. Procedure:         Pre-Anesthesia Assessment:                    - After reviewing the risks and benefits, the patient was                     deemed in satisfactory condition to undergo the procedure.                    After obtaining informed consent, the endoscope was passed                     under direct vision. Throughout the procedure, the                     patient's blood pressure, pulse, and oxygen saturations                     were monitored continuously. The Endoscope was introduced                     through the mouth, and advanced to the second part of                     duodenum. The upper GI endoscopy was accomplished without                     difficulty. The patient tolerated the procedure well. Findings:      There were esophageal mucosal changes suspicious for short-segment       Barrett's esophagus present at the lower esophageal sphincter. The       maximum longitudinal extent of these mucosal changes was 1-1 1/2 cm in       length. Mucosa was biopsied with a cold forceps for histology. One       specimen bottle was sent to pathology. GEJ 41cm.      Multiple small sessile polyps with no bleeding and no stigmata of recent       bleeding were found in the gastric body.      The examined duodenum was normal.      Diffuse mildly erythematous mucosa without bleeding was found in the       gastric antrum. Biopsies were taken with a cold forceps  for histology.       Biopsies were taken with a cold forceps for Helicobacter pylori testing. Impression:        - Esophageal mucosal changes suspicious for short-segment                     Barrett's esophagus. Biopsied.                    - Erythematous mucosa in the gastric body. Biopsied.                    -  Multiple gastric polyps.                    - Normal examined duodenum. Recommendation:    - Await pathology results. Take medicine. Manya Silvas, MD 03/27/2015 1:58:30 PM This report has been signed electronically. Number of Addenda: 0 Note Initiated On: 03/27/2015 12:56 PM      [facility]

## 2015-03-27 NOTE — Transfer of Care (Signed)
Immediate Anesthesia Transfer of Care Note  Patient: Julia Nunez  Procedure(s) Performed: Procedure(s): ESOPHAGOGASTRODUODENOSCOPY (EGD) (N/A) SAVORY DILATION (N/A) COLONOSCOPY WITH PROPOFOL (N/A)  Patient Location: PACU  Anesthesia Type:General  Level of Consciousness: awake, alert , oriented and patient cooperative  Airway & Oxygen Therapy: Patient Spontanous Breathing and Patient connected to nasal cannula oxygen  Post-op Assessment: Report given to RN and Post -op Vital signs reviewed and stable  Post vital signs: Reviewed and stable  Last Vitals:  Filed Vitals:   03/27/15 1425  BP: 100/64  Pulse: 90  Temp: 35.6 C  Resp: 17    Complications: No apparent anesthesia complications

## 2015-03-28 ENCOUNTER — Encounter: Payer: Self-pay | Admitting: Unknown Physician Specialty

## 2015-04-01 NOTE — Anesthesia Postprocedure Evaluation (Signed)
  Anesthesia Post-op Note  Patient: Julia Nunez  Procedure(s) Performed: Procedure(s): ESOPHAGOGASTRODUODENOSCOPY (EGD) (N/A) SAVORY DILATION (N/A) COLONOSCOPY WITH PROPOFOL (N/A)  Anesthesia type:General  Patient location: PACU  Post pain: Pain level controlled  Post assessment: Post-op Vital signs reviewed, Patient's Cardiovascular Status Stable, Respiratory Function Stable, Patent Airway and No signs of Nausea or vomiting  Post vital signs: Reviewed and stable  Last Vitals:  Filed Vitals:   03/27/15 1500  BP: 119/81  Pulse: 93  Temp:   Resp: 18    Level of consciousness: awake, alert  and patient cooperative  Complications: No apparent anesthesia complications

## 2015-04-02 LAB — SURGICAL PATHOLOGY

## 2015-05-01 ENCOUNTER — Other Ambulatory Visit: Payer: Self-pay | Admitting: Obstetrics and Gynecology

## 2015-05-07 ENCOUNTER — Other Ambulatory Visit: Payer: Self-pay | Admitting: Obstetrics and Gynecology

## 2015-05-07 MED ORDER — PHENDIMETRAZINE TARTRATE 35 MG PO TABS: 2.0000 | ORAL_TABLET | Freq: Every day | ORAL | Status: DC

## 2015-05-07 MED ORDER — ALPRAZOLAM 0.5 MG PO TABS: 0.5000 mg | ORAL_TABLET | Freq: Three times a day (TID) | ORAL | Status: DC | PRN

## 2015-05-07 MED ORDER — PHENDIMETRAZINE TARTRATE 35 MG PO TABS: 1.0000 | ORAL_TABLET | Freq: Every day | ORAL | Status: DC

## 2015-06-18 ENCOUNTER — Telehealth: Payer: Self-pay | Admitting: Obstetrics and Gynecology

## 2015-06-18 NOTE — Telephone Encounter (Signed)
She wanted to know if you could send in a RX for bacterial infection, she had one but it expired, [facility]ren [place]

## 2015-06-23 ENCOUNTER — Other Ambulatory Visit: Payer: Self-pay | Admitting: *Deleted

## 2015-06-23 MED ORDER — METRONIDAZOLE 0.75 % VA GEL: 1.0000 | Freq: Two times a day (BID) | VAGINAL | Status: DC

## 2015-06-23 NOTE — Telephone Encounter (Signed)
Done, refilled x 2-ac

## 2015-06-23 NOTE — Telephone Encounter (Signed)
Please call and see which one she was on and send in with 2 refills

## 2015-07-25 ENCOUNTER — Other Ambulatory Visit: Payer: Self-pay | Admitting: Obstetrics and Gynecology

## 2015-07-25 MED ORDER — ZOLPIDEM TARTRATE ER 12.5 MG PO TBCR: 12.5000 mg | EXTENDED_RELEASE_TABLET | Freq: Every day | ORAL | Status: DC

## 2015-08-06 ENCOUNTER — Telehealth: Payer: Self-pay | Admitting: Obstetrics and Gynecology

## 2015-08-06 ENCOUNTER — Encounter: Payer: Self-pay | Admitting: Podiatry

## 2015-08-06 NOTE — Telephone Encounter (Signed)
Pt is on ambien 1.5 extended release. Ins won't cover this only the ambien regular 10 mg. Will you switch to this.

## 2015-08-06 NOTE — Telephone Encounter (Signed)
pls advise

## 2015-08-07 ENCOUNTER — Other Ambulatory Visit: Payer: Self-pay | Admitting: Obstetrics and Gynecology

## 2015-08-07 MED ORDER — ZOLPIDEM TARTRATE 10 MG PO TABS: 10.0000 mg | ORAL_TABLET | Freq: Every evening | ORAL | Status: DC | PRN

## 2015-08-07 NOTE — Telephone Encounter (Signed)
Please let her know I switched it

## 2015-08-08 NOTE — Telephone Encounter (Signed)
Done-ac 

## 2015-11-24 ENCOUNTER — Encounter: Payer: Self-pay | Admitting: Obstetrics and Gynecology

## 2015-12-24 ENCOUNTER — Ambulatory Visit (INDEPENDENT_AMBULATORY_CARE_PROVIDER_SITE_OTHER): Payer: Managed Care, Other (non HMO) | Admitting: Obstetrics and Gynecology

## 2015-12-24 ENCOUNTER — Other Ambulatory Visit: Payer: Self-pay | Admitting: Obstetrics and Gynecology

## 2015-12-24 ENCOUNTER — Encounter: Payer: Self-pay | Admitting: Obstetrics and Gynecology

## 2015-12-24 VITALS — BP 137/85 | HR 102 | Ht 67.0 in | Wt 231.4 lb

## 2015-12-24 DIAGNOSIS — E559 Vitamin D deficiency, unspecified: Secondary | ICD-10-CM

## 2015-12-24 DIAGNOSIS — Z01419 Encounter for gynecological examination (general) (routine) without abnormal findings: Secondary | ICD-10-CM | POA: Diagnosis not present

## 2015-12-24 MED ORDER — ALPRAZOLAM 0.5 MG PO TABS: 0.5000 mg | ORAL_TABLET | Freq: Three times a day (TID) | ORAL | Status: DC | PRN

## 2015-12-24 NOTE — Patient Instructions (Signed)
Place annual gynecologic exam patient instructions here.

## 2015-12-24 NOTE — Progress Notes (Signed)
Subjective:   Julia Nunez is a 45 y.o. No obstetric history on file. Caucasian female here for a routine well-woman exam.  Patient's last menstrual period was 12/02/2015.    Current complaints: none PCP: ?       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  Past Medical History Past Medical History  Diagnosis Date  . Anxiety   . Hypertension   . Back pain     Past Surgical History Past Surgical History  Procedure Laterality Date  . Cesarean section  1999    X1  . Cystectomy      SEBACEOUS CYST ON SCALP  . Esophagogastroduodenoscopy N/A 03/27/2015    Procedure: ESOPHAGOGASTRODUODENOSCOPY (EGD);  Surgeon: Manya Silvas, MD;  Location[facility] ENDOSCOPY;  Service: Endoscopy;  Laterality: N/A;  . Savory dilation N/A 03/27/2015    Procedure: SAVORY DILATION;  Surgeon: Manya Silvas, MD;  Location: [facility] ENDOSCOPY;  Service: Endoscopy;  Laterality: N/A;  . Colonoscopy with propofol N/A 03/27/2015    Procedure: COLONOSCOPY WITH PROPOFOL;  Surgeon: Manya Silvas, MD;  Location: [facility] ENDOSCOPY;  Service: Endoscopy;  Laterality: N/A;    Gynecologic History No obstetric history on file.  Patient's last menstrual period was 12/02/2015. Contraception: IUD Last Pap: 2016. Results were: negative, +HPV Last mammogram: 2015. Results were: normal  Obstetric History OB History  No data available    Current Medications Current Outpatient Prescriptions on File Prior to Visit  Medication Sig Dispense Refill  . buPROPion (WELLBUTRIN XL) 150 MG 24 hr tablet   3  .  clobetasol cream (TEMOVATE) 0.05 %   1  . esomeprazole (NEXIUM) 40 MG capsule Take by mouth.    . hydrochlorothiazide (HYDRODIURIL) 25 MG tablet   0  . Vitamin D, Ergocalciferol, (DRISDOL) 50000 UNITS CAPS capsule   3  . ciprofloxacin (CIPRO) 500 MG tablet Reported on 12/24/2015  0  . fluconazole (DIFLUCAN) 150 MG tablet Reported on 12/24/2015  0  . meloxicam (MOBIC) 15 MG tablet Take by mouth. Reported on 12/24/2015    . methocarbamol (ROBAXIN) 500 MG tablet Take by mouth. Reported on 12/24/2015    . metroNIDAZOLE (METROGEL VAGINAL) 0.75 % vaginal gel Place 1 Applicatorful vaginally 2 (two) times daily. (Patient not taking: Reported on 12/24/2015) 70 g 2  . oxyCODONE (OXY IR/ROXICODONE) 5 MG immediate release tablet Take by mouth.    . oxyCODONE-acetaminophen (PERCOCET/ROXICET) 5-325 MG per tablet Take 1 tablet by mouth every 8 (eight) hours as needed for severe pain. Take 1-2 tabs prior to procedure on 6/22 (Patient not taking: Reported on 03/27/2015) 15 tablet 0  . Phendimetrazine Tartrate 35 MG TABS Take 2 tablets (70 mg total) by mouth daily. (Patient not taking: Reported on 12/24/2015) 60 each 2  . polyethylene glycol powder (GLYCOLAX/MIRALAX) powder Reported on 12/24/2015  0  . predniSONE (DELTASONE) 10 MG tablet Reported on 12/24/2015  0  . tiZANidine (ZANAFLEX) 4 MG tablet Reported on 12/24/2015  1  . zolpidem (AMBIEN) 10 MG tablet Take 1 tablet (10 mg total) by mouth at bedtime as needed for sleep. 30 tablet 3   Current Facility-Administered Medications on  File Prior to Visit  Medication Dose Route Frequency Provider Last Rate Last Dose  . triamcinolone acetonide (KENALOG) 10 MG/ML injection 10 mg  10 mg Other Once Trula Slade, DPM        Review of Systems Patient denies any headaches, blurred vision, shortness of breath, chest pain, abdominal pain, problems with bowel movements, urination, or intercourse.  Objective:  BP 137/85 mmHg  Pulse 102  Ht 5\' 7"  (1.702 m)  Wt 231 lb 6.4 oz  (104.962 kg)  BMI 36.23 kg/m2  LMP 12/02/2015 Physical Exam  General:  Well developed, well nourished, no acute distress. She is alert and oriented x3. Skin:  Warm and dry Neck:  Midline trachea, no thyromegaly or nodules Cardiovascular: Regular rate and rhythm, no murmur heard Lungs:  Effort normal, all lung fields clear to auscultation bilaterally Breasts:  No dominant palpable mass, retraction, or nipple discharge Abdomen:  Soft, non tender, no hepatosplenomegaly or masses Pelvic:  External genitalia is normal in appearance.  The vagina is normal in appearance. The cervix is bulbous, no CMT, with persistant white plaque noted at 12-1 oclock. IUD string noted.  Thin prep pap is done with HR HPV cotesting. Uterus is felt to be normal size, shape, and contour.  No adnexal masses or tenderness noted. Extremities:  No swelling or varicosities noted Psych:  She has a normal mood and affect  Assessment:   Healthy well-woman exam Obesity IUD use Anxiety H/O vitamin d deficiency Plan:  Labs obtained  F/U 1 year for AE, or sooner if needed Mammogram scheduled Melody Rockney Ghee, CNM

## 2015-12-26 LAB — CYTOLOGY - PAP

## 2015-12-31 ENCOUNTER — Ambulatory Visit
Admission: RE | Admit: 2015-12-31 | Discharge: 2015-12-31 | Disposition: A | Discharge status: Home / Self Care | Payer: Managed Care, Other (non HMO) | Source: Ambulatory Visit | Attending: Obstetrics and Gynecology | Admitting: Obstetrics and Gynecology

## 2015-12-31 DIAGNOSIS — Z1231 Encounter for screening mammogram for malignant neoplasm of breast: Secondary | ICD-10-CM | POA: Diagnosis not present

## 2015-12-31 DIAGNOSIS — Z01419 Encounter for gynecological examination (general) (routine) without abnormal findings: Secondary | ICD-10-CM

## 2016-01-01 ENCOUNTER — Other Ambulatory Visit: Payer: Self-pay | Admitting: Obstetrics and Gynecology

## 2016-01-01 DIAGNOSIS — R928 Other abnormal and inconclusive findings on diagnostic imaging of breast: Secondary | ICD-10-CM

## 2016-01-09 ENCOUNTER — Ambulatory Visit: Payer: Managed Care, Other (non HMO)

## 2016-01-15 ENCOUNTER — Other Ambulatory Visit: Payer: Self-pay | Admitting: *Deleted

## 2016-01-15 MED ORDER — BUPROPION HCL ER (XL) 150 MG PO TB24: 150.0000 mg | ORAL_TABLET | Freq: Every day | ORAL | Status: DC

## 2016-01-15 MED ORDER — HYDROCHLOROTHIAZIDE 25 MG PO TABS: 25.0000 mg | ORAL_TABLET | Freq: Every day | ORAL | Status: DC

## 2016-01-16 ENCOUNTER — Ambulatory Visit
Admission: RE | Admit: 2016-01-16 | Discharge: 2016-01-16 | Disposition: A | Discharge status: Home / Self Care | Payer: Managed Care, Other (non HMO) | Source: Ambulatory Visit | Attending: Obstetrics and Gynecology | Admitting: Obstetrics and Gynecology

## 2016-01-16 ENCOUNTER — Other Ambulatory Visit: Payer: Self-pay | Admitting: Obstetrics and Gynecology

## 2016-01-16 DIAGNOSIS — R928 Other abnormal and inconclusive findings on diagnostic imaging of breast: Secondary | ICD-10-CM

## 2016-01-20 ENCOUNTER — Other Ambulatory Visit: Payer: Self-pay | Admitting: Obstetrics and Gynecology

## 2016-01-20 ENCOUNTER — Encounter: Payer: Self-pay | Admitting: Obstetrics and Gynecology

## 2016-01-20 MED ORDER — ZOLPIDEM TARTRATE 10 MG PO TABS: 10.0000 mg | ORAL_TABLET | Freq: Every evening | ORAL | Status: DC | PRN

## 2016-01-22 ENCOUNTER — Encounter: Payer: Self-pay | Admitting: Obstetrics and Gynecology

## 2016-02-17 ENCOUNTER — Encounter: Payer: Self-pay | Admitting: Obstetrics and Gynecology

## 2016-04-07 ENCOUNTER — Ambulatory Visit (INDEPENDENT_AMBULATORY_CARE_PROVIDER_SITE_OTHER): Payer: Managed Care, Other (non HMO) | Admitting: Obstetrics and Gynecology

## 2016-04-07 ENCOUNTER — Encounter: Payer: Self-pay | Admitting: Obstetrics and Gynecology

## 2016-04-07 VITALS — BP 124/78 | HR 80 | Ht 67.0 in | Wt 233.7 lb

## 2016-04-07 DIAGNOSIS — E669 Obesity, unspecified: Secondary | ICD-10-CM

## 2016-04-07 DIAGNOSIS — Z302 Encounter for sterilization: Secondary | ICD-10-CM | POA: Diagnosis not present

## 2016-04-07 DIAGNOSIS — N946 Dysmenorrhea, unspecified: Secondary | ICD-10-CM | POA: Diagnosis not present

## 2016-04-07 DIAGNOSIS — E66811 Obesity, class 1: Secondary | ICD-10-CM

## 2016-04-07 MED ORDER — VITAMIN D (ERGOCALCIFEROL) 1.25 MG (50000 UNIT) PO CAPS: 50000.0000 [IU] | ORAL_CAPSULE | ORAL | 0 refills | Status: DC

## 2016-04-07 NOTE — Patient Instructions (Signed)
1. Laparoscopic bilateral salpingectomy scheduled 2. Return for preop appointment the week  3. Will need to get preop labs at [facility]. And bring results for review

## 2016-04-07 NOTE — Progress Notes (Signed)
GYN ENCOUNTER NOTE  Subjective:       Julia Nunez is a 45 y.o. [phone] female is here for gynecologic evaluation of the following issues:  1. Contraceptive management  Desires permanent sterilization. Currently with ParaGard IUD times many years Long history of dysmenorrhea and menorrhagia, tolerated; not interested in Mirena IUD Husband not a vasectomy candidate.     Gynecologic History Patient's last menstrual period was 03/17/2016 (approximate). Contraception: IUD Last Pap: Negative Last mammogram: BI-RADS 1 01/16/2016  Obstetric History OB History  Gravida Para Term Preterm AB Living  3 2 2   1 2   SAB TAB Ectopic Multiple Live Births  1       2    # Outcome Date GA Lbr Len/2nd Weight Sex Delivery Anes PTL Lv  3 Term 1999   8 lb 6.4 oz (3.81 kg) F CS-LTranv   LIV  2 SAB 1994          1 Term 1992   9 lb 2.1 oz (4.141 kg) F Vag-Spont   LIV      Past Medical History:  Diagnosis Date  . Anxiety   . Back pain   . Hypertension     Past Surgical History:  Procedure Laterality Date  . CESAREAN SECTION  1999   X1  . COLONOSCOPY WITH PROPOFOL N/A 03/27/2015   Procedure: COLONOSCOPY WITH PROPOFOL;  Surgeon: Manya Silvas, MD;  Location: [facility] ENDOSCOPY;  Service: Endoscopy;  Laterality: N/A;  . CYSTECTOMY     SEBACEOUS CYST ON SCALP  . ESOPHAGOGASTRODUODENOSCOPY N/A 03/27/2015   Procedure: ESOPHAGOGASTRODUODENOSCOPY (EGD);  Surgeon: Manya Silvas, MD;  Location: [facility] ENDOSCOPY;  Service: Endoscopy;  Laterality: N/A;  . SAVORY DILATION N/A 03/27/2015   Procedure: SAVORY DILATION;  Surgeon: Manya Silvas, MD;  Location: [facility] ENDOSCOPY;  Service: Endoscopy;  Laterality: N/A;    Current Outpatient Prescriptions on File Prior to Visit  Medication Sig Dispense Refill  . ALPRAZolam (XANAX) 0.5 MG tablet Take 1 tablet (0.5 mg total) by mouth 3 (three) times daily as needed for anxiety. 50 tablet 2  . buPROPion (WELLBUTRIN XL) 150 MG 24 hr tablet Take 1 tablet (150 mg  total) by mouth daily. 30 tablet 3  . clobetasol cream (TEMOVATE) 0.05 %   1  . esomeprazole (NEXIUM) 40 MG capsule Take by mouth.    . hydrochlorothiazide (HYDRODIURIL) 25 MG tablet Take 1 tablet (25 mg total) by mouth daily. 30 tablet 11  . PARAGARD INTRAUTERINE COPPER IU by Intrauterine route.    Marland Kitchen zolpidem (AMBIEN) 10 MG tablet Take 1 tablet (10 mg total) by mouth at bedtime as needed for sleep. 30 tablet 3  . polyethylene glycol powder (GLYCOLAX/MIRALAX) powder Reported on 12/24/2015  0   No current facility-administered medications on file prior to visit.     Allergies  Allergen Reactions  . Hydrocodone-Acetaminophen Nausea Only    Social History   Social History  . Marital status: Married    Spouse name: N/A  . Number of children: N/A  . Years of education: N/A   Occupational History  . Not on file.   Social History Main Topics  . Smoking status: Former Research scientist (life sciences)  . Smokeless tobacco: Never Used  . Alcohol use 0.0 oz/week     Comment: OCCASIONALLY  . Drug use: No  . Sexual activity: Yes    Birth control/ protection: IUD     Comment: paragard   Other Topics Concern  . Not on file   Social  History Narrative  . No narrative on file    Family History  Problem Relation Age of Onset  . Hypertension Father   . Diabetes Father   . Breast cancer Neg Hx     The following portions of the patient's history were reviewed and updated as appropriate: allergies, current medications, past family history, past medical history, past social history, past surgical history and problem list.  Review of Systems Review of Systems -Per history of present illness  Objective:   BP 124/78   Pulse 80   Ht 5\' 7"  (1.702 m)   Wt 233 lb 11.2 oz (106 kg)   LMP 03/17/2016 (Approximate)   BMI 36.60 kg/m  CONSTITUTIONAL: Well-developed, well-nourished female in no acute distress.  Physical exam-deferred  Assessment:   1. Desires elective sterilization 2. Contraception-ParaGard 3.  Obesity 4. Dysmenorrhea    Plan:  1. Medical and surgical contraceptive options were reviewed including the use of Mirena IUD to help with dysmenorrhea and menorrhagia-patient declines 2. Vasectomy declined 3. Laparoscopic bilateral salpingectomy will be scheduled 4. Return for preop appointment  A total of 15 minutes were spent face-to-face with the patient during this encounter and over half of that time dealt with counseling and coordination of care.  Brayton Mars, MD  Note: This dictation was prepared with Dragon dictation along with smaller phrase technology. Any transcriptional errors that result from this process are unintentional.

## 2016-04-27 ENCOUNTER — Encounter: Payer: Self-pay | Admitting: Obstetrics and Gynecology

## 2016-04-28 ENCOUNTER — Other Ambulatory Visit: Payer: Self-pay

## 2016-04-28 DIAGNOSIS — Z302 Encounter for sterilization: Secondary | ICD-10-CM

## 2016-04-28 DIAGNOSIS — Z01818 Encounter for other preprocedural examination: Secondary | ICD-10-CM

## 2016-05-19 ENCOUNTER — Encounter
Admission: RE | Admit: 2016-05-19 | Discharge: 2016-05-19 | Disposition: A | Discharge status: Home / Self Care | Payer: Managed Care, Other (non HMO) | Source: Ambulatory Visit | Attending: Obstetrics and Gynecology | Admitting: Obstetrics and Gynecology

## 2016-05-19 ENCOUNTER — Encounter: Payer: Self-pay | Admitting: Obstetrics and Gynecology

## 2016-05-19 ENCOUNTER — Ambulatory Visit (INDEPENDENT_AMBULATORY_CARE_PROVIDER_SITE_OTHER): Payer: Managed Care, Other (non HMO) | Admitting: Obstetrics and Gynecology

## 2016-05-19 VITALS — BP 125/83 | HR 92 | Ht 67.0 in | Wt 235.7 lb

## 2016-05-19 DIAGNOSIS — Z01818 Encounter for other preprocedural examination: Secondary | ICD-10-CM

## 2016-05-19 DIAGNOSIS — Z302 Encounter for sterilization: Secondary | ICD-10-CM

## 2016-05-19 HISTORY — DX: Headache, unspecified: R51.9

## 2016-05-19 HISTORY — DX: Gastro-esophageal reflux disease without esophagitis: K21.9

## 2016-05-19 HISTORY — DX: Adverse effect of unspecified anesthetic, initial encounter: T41.45XA

## 2016-05-19 HISTORY — DX: Other complications of anesthesia, initial encounter: T88.59XA

## 2016-05-19 HISTORY — DX: Headache: R51

## 2016-05-19 HISTORY — DX: Nausea with vomiting, unspecified: R11.2

## 2016-05-19 HISTORY — DX: Family history of other specified conditions: Z84.89

## 2016-05-19 HISTORY — DX: Other specified postprocedural states: Z98.890

## 2016-05-19 HISTORY — DX: Sleep apnea, unspecified: G47.30

## 2016-05-19 NOTE — Pre-Procedure Instructions (Signed)
ECG 12-lead1/05/2015 [place] Component Name Value Ref Range  Vent Rate (bpm) 87   PR Interval (msec) 116   QRS Interval (msec) 82   QT Interval (msec) 366   QTc (msec) 440   Result Narrative  Normal sinus rhythm Normal ECG No previous ECGs available I reviewed and concur with this report. Electronically signed [id number], RAJA ([id number]) on 11/27/2014 11:52:01 AM  Status Results Details    Office Visit on 07/26/1839 [place]")' [email or web address]/">Encounter Summary

## 2016-05-19 NOTE — Pre-Procedure Instructions (Signed)
Echocardiogram stress test (09/26/2014 9:00 AM) Echocardiogram stress test (09/26/2014 9:00 AM)  Specimen Performing Laboratory   [facility] OTHER ORDERS    Echocardiogram stress test (09/26/2014 9:00 AM)  Narrative  Julia Nunez [facility] [id number]  [place], [place], [place] [id number] Acct #: [id number]   Date: 09/26/2014 08:07 AM   ECHOCARDIOGRAM REPORT Adult Female Age: 45 yrs   Outpatient  STUDY:Stress EchoTAPE: KC::KCMC   ECHO:Yes DOPPLER:YesFILE: MD1:  COLOR:YesCONTRAST:NoMACHINE:AccusonBP: 119/82 mmHg  RV BIOPSY:No 3D:No SOUND QLTY:Moderate HR: 114   MEDIUM:None Height: 67 in    ___________________________________________________________________________________________  REASON:Assess, LV function  Indication:R07.89 (ICD-10-CM) - Atypical chest pain    ___________________________________________________________________________________________  STRESS ECHOCARDIOGRAPHY     Protocol:Treadmill (Bruce)  Drugs:None  Target Heart Rate:150 bpmMaximum Predicted Heart Rate: 177 bpm    +-------------------+-------------------------+-------------------------+------------+   Stage  Duration (mm:ss) Heart Rate (bpm) BP   +-------------------+-------------------------+-------------------------+------------+  rest 13:26 114   119/82   +-------------------+-------------------------+-------------------------+------------+   stages 9:34 153  /  +-------------------+-------------------------+-------------------------+------------+  post 14:09 113  131/85   +-------------------+-------------------------+-------------------------+------------+    Stress Duration:9:34 mm:ss *  Max Stress H.R.:153 bpm *Target Heart Rate Achieved: Yes  Maximum workload of 11.90 METS was achieved during exercise    ___________________________________________________________________________________________    WALL SEGMENT CHANGES    RestStress  Anterior Septum Basal:NormalHyperkinetic  [id number]   Apical:NormalHyperkinetic    Anterior Wall Basal:NormalHyperkinetic  [id number]   Apical:NormalHyperkinetic     Lateral Wall Basal:NormalHyperkinetic  [id number]   Apical:NormalHyperkinetic    Posterior Wall Basal:NormalHyperkinetic  [id number]    Inferior Wall Basal:NormalHyperkinetic  [id number]   Apical:NormalHyperkinetic    Inferior Septum Basal:NormalHyperkinetic  [id number]     Resting EF:>55% (Est.) Stress EF: >55% (Est.)    ___________________________________________________________________________________________    ADDITIONAL FINDINGS    Chest Discomfort:None  Arrhythmia:None  Termination Reason:Fatigue    Adverse Effects:None   Complications:None    ___________________________________________________________________________________________    STRESS ECG RESULTS     ECG Results:Normal    ___________________________________________________________________________________________  ECHOCARDIOGRAPHIC DESCRIPTIONS    LEFT VENTRICLE  Size:Normal  Contraction:Normal   LV Masses:No Masses   [id number] LVH  Dias.FxClass:Normal    RIGHT VENTRICLE  Size:Normal Free Wall:Normal  Contraction:Normal RV Masses:No mass    PERICARDIUM   Fluid:No effusion      _______________________________________________________________________________________  DOPPLER ECHO and OTHER SPECIAL PROCEDURES   Aortic:No ARNo AS  100.2 cm/sec peak vel4.0 mmHg peak grad     Mitral:No MRNo MS  MV Inflow E Vel=nm*MV Annulus E'Vel=nm*  E/E'Ratio=nm*    Tricuspid:TRIVIAL TR No TS  227.3 cm/sec peak TR vel 30.7 mmHg peak RV pressure    Pulmonary:MILD PRNo PS  66.0 cm/sec peak vel 1.7 mmHg peak grad        ___________________________________________________________________________________________  ECHOCARDIOGRAPHIC MEASUREMENTS  2D DIMENSIONS  AORTA ValuesNormal RangeMAIN PAValuesNormal Range  Annulus:nm* [2.1 - 2.5]PA Main:nm* [1.5 - 2.1]  Aorta Sin:nm* [2.7 - 3.3] RIGHT VENTRICLE  ST Junction:nm* [2.3 - 2.9]RV Base:nm* [ < 4.2]  Asc.Aorta:nm* [2.3 - 3.1] RV Mid:nm* [ < 3.5]  LEFT  VENTRICLERV Length:nm* [ < 8.6]  LVIDd:4.6 cm[3.9 - 5.3] INFERIOR VENA CAVA  LVIDs:3.2 cmMax. IVC:nm* [ <= 2.1]   FS:30.4 %[> 25]Min. IVC:nm*  SWT:1.1 cm[0.5 - 0.9] ------------------  PWT:0.88 cm [0.5 - 0.9] nm* - not measured  LEFT ATRIUM  LA Diam:3.6 cm[2.7 - 3.8]  LA A4C Area:nm* [ < 20]  LA Volume:nm* [22 - 52]      ___________________________________________________________________________________________  INTERPRETATION  Normal Stress Echocardiogram  Post  Stress EF:>55% (Est.)  Resting EF: >55% (Est.)  ECG Results: Normal      ___________________________________________________________________________________________  Electronically signed by: MD [name] on 09/27/2014 12:47 PM  Performed By: Maurilio Lovely, [place]  Ordering Physician: Bartholome Bill    ___________________________________________________________________________________________    Echocardiogram stress test (09/26/2014 9:00 AM)  Procedure Note  Interface, Text Results In - 09/27/2014 12:49 PM EST  [name], [place] [id number] [place], [place], [place] [id number] Acct #: [id number] Date: 09/26/2014 08:07 AM ECHOCARDIOGRAM REPORT Adult Female Age: 10 yrs Outpatient STUDY:Stress Echo TAPE: KC::KCMC ECHO:Yes DOPPLER:Yes FILE: MD1: COLOR:Yes CONTRAST:No MACHINE:Accuson BP: 119/82 mmHg RV BIOPSY:No 3D:No SOUND QLTY:Moderate HR: 114 MEDIUM:None Height: 67 in  ___________________________________________________________________________________________ REASON:Assess, LV function Indication:R07.89 (ICD-10-CM) - Atypical chest  pain  ___________________________________________________________________________________________ STRESS ECHOCARDIOGRAPHY  Protocol:Treadmill (Bruce) Drugs:None Target Heart Rate:150 bpm Maximum Predicted Heart Rate: 177 bpm  +-------------------+-------------------------+-------------------------+------------+  Stage  Duration (mm:ss)  Heart Rate (bpm)   BP  +-------------------+-------------------------+-------------------------+------------+  rest  13:26  114   119/82  +-------------------+-------------------------+-------------------------+------------+  stages  9:34  153   /  +-------------------+-------------------------+-------------------------+------------+  post  14:09  113   131/85  +-------------------+-------------------------+-------------------------+------------+  Stress Duration:9:34 mm:ss * Max Stress H.R.:153 bpm * Target Heart Rate Achieved: Yes Maximum workload of 11.90 METS was achieved during exercise  ___________________________________________________________________________________________  WALL SEGMENT CHANGES  Rest Stress Anterior Septum Basal:Normal Hyperkinetic [id number] Hyperkinetic Apical:Normal Hyperkinetic  Anterior Wall Basal:Normal Hyperkinetic [id number] Hyperkinetic Apical:Normal Hyperkinetic  Lateral Wall Basal:Normal Hyperkinetic [id number] Hyperkinetic Apical:Normal Hyperkinetic  Posterior Wall Basal:Normal Hyperkinetic [id number] Hyperkinetic  Inferior Wall Basal:Normal Hyperkinetic [id number] Hyperkinetic Apical:Normal Hyperkinetic  Inferior Septum Basal:Normal Hyperkinetic [id number] Hyperkinetic  Resting EF:>55% (Est.) Stress EF: >55% (Est.)  ___________________________________________________________________________________________  ADDITIONAL FINDINGS  Chest Discomfort:None Arrhythmia:None Termination Reason:Fatigue Adverse  Effects:None Complications:None  ___________________________________________________________________________________________  STRESS ECG RESULTS  ECG Results:Normal  ___________________________________________________________________________________________ ECHOCARDIOGRAPHIC DESCRIPTIONS  LEFT VENTRICLE Size:Normal Contraction:Normal LV Masses:No Masses [id number] LVH Dias.FxClass:Normal  RIGHT VENTRICLE Size:Normal Free Wall:Normal Contraction:Normal RV Masses:No mass  PERICARDIUM Fluid:No effusion   _______________________________________________________________________________________ DOPPLER ECHO and OTHER SPECIAL PROCEDURES Aortic:No AR No AS 100.2 cm/sec peak vel 4.0 mmHg peak grad  Mitral:No MR No MS MV Inflow E Vel=nm* MV Annulus E'Vel=nm* E/E'Ratio=nm*  Tricuspid:TRIVIAL TR No TS 227.3 cm/sec peak TR vel 30.7 mmHg peak RV pressure  Pulmonary:MILD PR No PS 66.0 cm/sec peak vel 1.7 mmHg peak grad    ___________________________________________________________________________________________ ECHOCARDIOGRAPHIC MEASUREMENTS 2D DIMENSIONS AORTA Values Normal Range MAIN PA Values  Normal Range Annulus: nm* [2.1 - 2.5] PA Main: nm*  [1.5 - 2.1] Aorta Sin: nm* [2.7 - 3.3] RIGHT VENTRICLE ST Junction: nm* [2.3 - 2.9] RV Base: nm*  [ < 4.2] Asc.Aorta: nm* [2.3 - 3.1] RV Mid: nm*  [ < 3.5] LEFT VENTRICLE RV Length: nm*  [ < 8.6] LVIDd: 4.6 cm [3.9 - 5.3] INFERIOR VENA CAVA LVIDs: 3.2 cm Max. IVC: nm*  [ <= 2.1] FS: 30.4 % [> 25] Min. IVC: nm* SWT: 1.1 cm [0.5 - 0.9] ------------------ PWT: 0.88 cm [0.5 - 0.9] nm* - not measured LEFT ATRIUM LA Diam: 3.6 cm [2.7 - 3.8] LA A4C Area: nm* [ < 20] LA Volume: nm* [22 - 52]   ___________________________________________________________________________________________ INTERPRETATION Normal Stress Echocardiogram Post Stress EF:>55% (Est.) Resting EF: >55% (Est.) ECG Results:  Normal   ___________________________________________________________________________________________ Electronically signed by: MD [name] on 09/27/2014 12:47 PM Performed By: Maurilio Lovely, [place] Ordering Physician: Bartholome Bill  ___________________________________________________________________________________________

## 2016-05-19 NOTE — Patient Instructions (Signed)
1.  Return in 1 week for postop check 

## 2016-05-19 NOTE — Progress Notes (Signed)
Subjective:  PREOPERATIVE HISTORY AND PHYSICAL   Date of surgery: 05/24/2016 Procedure: Laparoscopic bilateral tubal banding Falope-Rings   Patient is a 45 y.o. G3P2077female scheduled for laparoscopic bilateral tubal banding with Falope-Rings for permanent sterilization.   Pertinent Gynecological History: Patient's last menstrual period was 03/17/2016 (approximate). Contraception: IUD Last Pap: Negative Last mammogram: BI-RADS 1 01/16/2016   Menstrual History: OB History    Gravida Para Term Preterm AB Living   3 2 2   1 2    SAB TAB Ectopic Multiple Live Births   1       2      Menarche age:na Patient's last menstrual period was 05/15/2016 (exact date).    Past Medical History:  Diagnosis Date  . Anxiety   . Back pain   . Hypertension     Past Surgical History:  Procedure Laterality Date  . CESAREAN SECTION  1999   X1  . COLONOSCOPY WITH PROPOFOL N/A 03/27/2015   Procedure: COLONOSCOPY WITH PROPOFOL;  Surgeon: Manya Silvas, MD;  Location: [facility] ENDOSCOPY;  Service: Endoscopy;  Laterality: N/A;  . CYSTECTOMY     SEBACEOUS CYST ON SCALP  . ESOPHAGOGASTRODUODENOSCOPY N/A 03/27/2015   Procedure: ESOPHAGOGASTRODUODENOSCOPY (EGD);  Surgeon: Manya Silvas, MD;  Location: [facility] ENDOSCOPY;  Service: Endoscopy;  Laterality: N/A;  . SAVORY DILATION N/A 03/27/2015   Procedure: SAVORY DILATION;  Surgeon: Manya Silvas, MD;  Location: [facility] ENDOSCOPY;  Service: Endoscopy;  Laterality: N/A;    OB History  Gravida Para Term Preterm AB Living  3 2 2   1 2   SAB TAB Ectopic Multiple Live Births  1       2    # Outcome Date GA Lbr Len/2nd Weight Sex Delivery Anes PTL Lv  3 Term 1999   8 lb 6.4 oz (3.81 kg) F CS-LTranv   LIV  2 SAB 1994          1 Term 1992   9 lb 2.1 oz (4.141 kg) F Vag-Spont   LIV      Social History   Social History  . Marital status: Married    Spouse name: N/A  . Number of children: N/A  . Years of education: N/A   Social History Main Topics  .  Smoking status: Former Research scientist (life sciences)  . Smokeless tobacco: Never Used  . Alcohol use 0.0 oz/week     Comment: OCCASIONALLY  . Drug use: No  . Sexual activity: Yes    Birth control/ protection: IUD     Comment: paragard   Other Topics Concern  . None   Social History Narrative  . None    Family History  Problem Relation Age of Onset  . Hypertension Father   . Diabetes Father   . Breast cancer Neg Hx      (Not in a hospital admission)  Allergies  Allergen Reactions  . Hydrocodone-Acetaminophen Nausea Only    Review of Systems Constitutional: No recent fever/chills/sweats Respiratory: No recent cough/bronchitis Cardiovascular: No chest pain Gastrointestinal: No recent nausea/vomiting/diarrhea Genitourinary: No UTI symptoms Hematologic/lymphatic:No history of coagulopathy or recent blood thinner use    Objective:    BP 125/83   Pulse 92   Ht 5\' 7"  (1.702 m)   Wt 235 lb 11.2 oz (106.9 kg)   LMP 05/15/2016 (Exact Date)   BMI 36.92 kg/m   General:   Normal  Skin:   normal  HEENT:  Normal  Neck:  Supple without Adenopathy or Thyromegaly  Lungs:  Heart:              Breasts:   Abdomen:  Pelvis:  M/S   Extremeties:  Neuro:    clear to auscultation bilaterally   Normal without murmur   Not Examined   soft, non-tender; bowel sounds normal; no masses,  no organomegaly   Exam deferred to OR  No CVAT  Warm/Dry   Normal          Assessment:    Desires permanent sterilization   Plan:   Laparoscopic bilateral tubal banding with Falope-Rings  Preoperative counseling: Patient is to undergo sterilization procedure on 05/24/2016. She is understanding of the planned procedure and is aware of and is accepting of all surgical risks which include but are not limited to bleeding, infection, pelvic organ injury with need for repair, blood clot disorders, anesthesia risks, failure rate of 1 out of 300, etc. All questions have been answered. Informed consent is given.  Patient is ready and willing to proceed with surgery as scheduled.  Brayton Mars, MD  Note: This dictation was prepared with Dragon dictation along with smaller phrase technology. Any transcriptional errors that result from this process are unintentional.

## 2016-05-19 NOTE — Patient Instructions (Signed)
  Your procedure is scheduled on: 05-24-16 Report to Same Day Surgery 2nd floor medical mall To find out your arrival time please call [phone] between 1PM - 3PM on 05-21-16  Remember: Instructions that are not followed completely may result in serious medical risk, up to and including death, or upon the discretion of your surgeon and anesthesiologist your surgery may need to be rescheduled.    _x___ 1. Do not eat food or drink liquids after midnight. No gum chewing or hard candies.     __x__ 2. No Alcohol for 24 hours before or after surgery.   __x__3. No Smoking for 24 prior to surgery.   ____  4. Bring all medications with you on the day of surgery if instructed.    __x__ 5. Notify your doctor if there is any change in your medical condition     (cold, fever, infections).     Do not wear jewelry, make-up, hairpins, clips or nail polish.  Do not wear lotions, powders, or perfumes. You may wear deodorant.  Do not shave 48 hours prior to surgery. Men may shave face and neck.  Do not bring valuables to the hospital.    [facility] is not responsible for any belongings or valuables.               Contacts, dentures or bridgework may not be worn into surgery.  Leave your suitcase in the car. After surgery it may be brought to your room.  For patients admitted to the hospital, discharge time is determined by your treatment team.   Patients discharged the day of surgery will not be allowed to drive home.    Please read over the following fact sheets that you were given:   [facility] Preparing for Surgery and or MRSA Information   _x___ Take these medicines the morning of surgery with A SIP OF WATER:    1. Swain  2. TAKE AN EXTRA NEXIUM ON Sunday NIGHT BEFORE BED  3.  4.  5.  6.  ____Fleets enema or Magnesium Citrate as directed.   ____ Use CHG Soap or sage wipes as directed on instruction sheet   ____ Use inhalers on the day of surgery and bring to hospital day of  surgery  ____ Stop metformin 2 days prior to surgery    ____ Take 1/2 of usual insulin dose the night before surgery and none on the morning of  surgery.   ____ Stop aspirin or coumadin, or plavix  x__ Stop Anti-inflammatories such as Advil, Aleve, Ibuprofen, Motrin, Naproxen,          Naprosyn, Goodies powders or aspirin products. Ok to take Tylenol.   _X___ Stop supplements until after surgery-STOP MELATONIN NOW  _X___ Bring C-Pap to the hospital

## 2016-05-19 NOTE — H&P (Signed)
Subjective:  PREOPERATIVE HISTORY AND PHYSICAL   Date of surgery: 05/24/2016 Procedure: Laparoscopic bilateral tubal banding Falope-Rings   Patient is a 45 y.o. G3P2050female scheduled for laparoscopic bilateral tubal banding with Falope-Rings for permanent sterilization.   Pertinent Gynecological History: Patient's last menstrual period was 03/17/2016 (approximate). Contraception: IUD Last Pap: Negative Last mammogram: BI-RADS 1 01/16/2016   Menstrual History: OB History    Gravida Para Term Preterm AB Living   3 2 2   1 2    SAB TAB Ectopic Multiple Live Births   1       2      Menarche age:na Patient's last menstrual period was 05/15/2016 (exact date).    Past Medical History:  Diagnosis Date  . Anxiety   . Back pain   . Hypertension     Past Surgical History:  Procedure Laterality Date  . CESAREAN SECTION  1999   X1  . COLONOSCOPY WITH PROPOFOL N/A 03/27/2015   Procedure: COLONOSCOPY WITH PROPOFOL;  Surgeon: Manya Silvas, MD;  Location: [facility] ENDOSCOPY;  Service: Endoscopy;  Laterality: N/A;  . CYSTECTOMY     SEBACEOUS CYST ON SCALP  . ESOPHAGOGASTRODUODENOSCOPY N/A 03/27/2015   Procedure: ESOPHAGOGASTRODUODENOSCOPY (EGD);  Surgeon: Manya Silvas, MD;  Location: [facility] ENDOSCOPY;  Service: Endoscopy;  Laterality: N/A;  . SAVORY DILATION N/A 03/27/2015   Procedure: SAVORY DILATION;  Surgeon: Manya Silvas, MD;  Location: [facility] ENDOSCOPY;  Service: Endoscopy;  Laterality: N/A;    OB History  Gravida Para Term Preterm AB Living  3 2 2   1 2   SAB TAB Ectopic Multiple Live Births  1       2    # Outcome Date GA Lbr Len/2nd Weight Sex Delivery Anes PTL Lv  3 Term 1999   8 lb 6.4 oz (3.81 kg) F CS-LTranv   LIV  2 SAB 1994          1 Term 1992   9 lb 2.1 oz (4.141 kg) F Vag-Spont   LIV      Social History   Social History  . Marital status: Married    Spouse name: N/A  . Number of children: N/A  . Years of education: N/A   Social History Main Topics  .  Smoking status: Former Research scientist (life sciences)  . Smokeless tobacco: Never Used  . Alcohol use 0.0 oz/week     Comment: OCCASIONALLY  . Drug use: No  . Sexual activity: Yes    Birth control/ protection: IUD     Comment: paragard   Other Topics Concern  . None   Social History Narrative  . None    Family History  Problem Relation Age of Onset  . Hypertension Father   . Diabetes Father   . Breast cancer Neg Hx      (Not in a hospital admission)  Allergies  Allergen Reactions  . Hydrocodone-Acetaminophen Nausea Only    Review of Systems Constitutional: No recent fever/chills/sweats Respiratory: No recent cough/bronchitis Cardiovascular: No chest pain Gastrointestinal: No recent nausea/vomiting/diarrhea Genitourinary: No UTI symptoms Hematologic/lymphatic:No history of coagulopathy or recent blood thinner use    Objective:    BP 125/83   Pulse 92   Ht 5\' 7"  (1.702 m)   Wt 235 lb 11.2 oz (106.9 kg)   LMP 05/15/2016 (Exact Date)   BMI 36.92 kg/m   General:   Normal  Skin:   normal  HEENT:  Normal  Neck:  Supple without Adenopathy or Thyromegaly  Lungs:  Heart:              Breasts:   Abdomen:  Pelvis:  M/S   Extremeties:  Neuro:    clear to auscultation bilaterally   Normal without murmur   Not Examined   soft, non-tender; bowel sounds normal; no masses,  no organomegaly   Exam deferred to OR  No CVAT  Warm/Dry   Normal          Assessment:    Desires permanent sterilization   Plan:   Laparoscopic bilateral tubal banding with Falope-Rings  Preoperative counseling: Patient is to undergo sterilization procedure on 05/24/2016. She is understanding of the planned procedure and is aware of and is accepting of all surgical risks which include but are not limited to bleeding, infection, pelvic organ injury with need for repair, blood clot disorders, anesthesia risks, failure rate of 1 out of 300, etc. All questions have been answered. Informed consent is given.  Patient is ready and willing to proceed with surgery as scheduled.  Brayton Mars, MD  Note: This dictation was prepared with Dragon dictation along with smaller phrase technology. Any transcriptional errors that result from this process are unintentional.

## 2016-05-19 NOTE — Pre-Procedure Instructions (Signed)
Echocardiogram stress test3/09/2014 [name]  Julia Nunez, [facility] [id number] [place], [place], [place] [id number] Acct #: [id number]  Date: 09/26/2014 08:07 AM  ECHOCARDIOGRAM REPORT Adult Female Age: 45 yrs  Outpatient STUDY:Stress EchoTAPE: KC::KCMC  ECHO:Yes DOPPLER:YesFILE: MD1: COLOR:YesCONTRAST:NoMACHINE:AccusonBP: 119/82 mmHg RV BIOPSY:No 3D:No SOUND QLTY:Moderate HR: 114  MEDIUM:None Height: 67 in  ___________________________________________________________________________________________ REASON:Assess, LV function Indication:R07.89 (ICD-10-CM) - Atypical chest pain  ___________________________________________________________________________________________ STRESS ECHOCARDIOGRAPHY   Protocol:Treadmill (Bruce) Drugs:None Target Heart Rate:150 bpmMaximum Predicted Heart Rate: 177 bpm  +-------------------+-------------------------+-------------------------+------------+  Stage  Duration (mm:ss) Heart Rate (bpm) BP  +-------------------+-------------------------+-------------------------+------------+ rest 13:26 114  119/82  +-------------------+-------------------------+-------------------------+------------+  stages 9:34 153   / +-------------------+-------------------------+-------------------------+------------+ post 14:09 113  131/85  +-------------------+-------------------------+-------------------------+------------+  Stress Duration:9:34 mm:ss * Max Stress H.R.:153 bpm *Target Heart Rate Achieved: Yes Maximum workload of 11.90 METS was achieved during exercise  ___________________________________________________________________________________________  WALL SEGMENT CHANGES  RestStress Anterior Septum Basal:NormalHyperkinetic [id number]  Apical:NormalHyperkinetic  Anterior Wall Basal:NormalHyperkinetic [id number]  Apical:NormalHyperkinetic   Lateral Wall Basal:NormalHyperkinetic [id number]  Apical:NormalHyperkinetic   Posterior Wall Basal:NormalHyperkinetic [id number]  Inferior Wall Basal:NormalHyperkinetic [id number]  Apical:NormalHyperkinetic  Inferior Septum Basal:NormalHyperkinetic [id number]   Resting EF:>55% (Est.) Stress EF: >55% (Est.)  ___________________________________________________________________________________________  ADDITIONAL FINDINGS  Chest Discomfort:None Arrhythmia:None Termination Reason:Fatigue  Adverse Effects:None  Complications:None  ___________________________________________________________________________________________  STRESS ECG RESULTS   ECG  Results:Normal  ___________________________________________________________________________________________ ECHOCARDIOGRAPHIC DESCRIPTIONS  LEFT VENTRICLE Size:Normal  Contraction:Normal  LV Masses:No Masses  [id number] LVH Dias.FxClass:Normal  RIGHT VENTRICLE Size:Normal Free Wall:Normal  Contraction:Normal RV Masses:No mass  PERICARDIUM  Fluid:No effusion   _______________________________________________________________________________________ DOPPLER ECHO and OTHER SPECIAL PROCEDURES  Aortic:No ARNo AS 100.2 cm/sec peak vel4.0 mmHg peak grad   Mitral:No MRNo MS MV Inflow E Vel=nm*MV Annulus E'Vel=nm* E/E'Ratio=nm*  Tricuspid:TRIVIAL TR No TS 227.3 cm/sec peak TR vel 30.7 mmHg peak RV pressure  Pulmonary:MILD PRNo PS 66.0 cm/sec peak vel 1.7 mmHg peak grad    ___________________________________________________________________________________________ ECHOCARDIOGRAPHIC MEASUREMENTS 2D DIMENSIONS AORTA ValuesNormal RangeMAIN PAValuesNormal Range Annulus:nm* [2.1 - 2.5]PA Main:nm* [1.5 - 2.1] Aorta Sin:nm* [2.7 - 3.3] RIGHT VENTRICLE ST Junction:nm* [2.3 - 2.9]RV Base:nm* [ < 4.2] Asc.Aorta:nm* [2.3 - 3.1] RV Mid:nm* [ < 3.5]  LEFT VENTRICLERV Length:nm* [ < 8.6] LVIDd:4.6 cm[3.9 - 5.3] INFERIOR VENA CAVA LVIDs:3.2 cmMax. IVC:nm* [ <= 2.1]  FS:30.4 %[> 25]Min. IVC:nm* SWT:1.1 cm[0.5 -  0.9] ------------------ PWT:0.88 cm [0.5 - 0.9] nm* - not measured  LEFT ATRIUM LA Diam:3.6 cm[2.7 - 3.8] LA A4C Area:nm* [ < 20] LA Volume:nm* [22 - 52]   ___________________________________________________________________________________________ INTERPRETATION Normal Stress Echocardiogram Post Stress EF:>55% (Est.) Resting EF: >55% (Est.) ECG Results: Normal   ___________________________________________________________________________________________ Electronically signed by: MD [name] on 09/27/2014 12:47 PM Performed By: Maurilio Lovely, RDCS Ordering Physician: Bartholome Bill  ___________________________________________________________________________________________  Status Results Details    Procedure visit on 07/26/1839 [place]")' [email or web address]/">Encounter Summary

## 2016-05-20 ENCOUNTER — Other Ambulatory Visit: Payer: Self-pay | Admitting: *Deleted

## 2016-05-20 ENCOUNTER — Encounter: Payer: Self-pay | Admitting: Obstetrics and Gynecology

## 2016-05-21 ENCOUNTER — Encounter: Payer: Self-pay | Admitting: Obstetrics and Gynecology

## 2016-05-21 NOTE — Pre-Procedure Instructions (Signed)
Pt denies being diabetic but preop labs show glucose of 230-Faxed this over to [facility] to Dr Baron Sane to inform him of this-fax confirmation received that it went thru successfully

## 2016-05-24 ENCOUNTER — Encounter
Admission: RE | Disposition: A | Discharge status: Home / Self Care | Payer: Self-pay | Source: Ambulatory Visit | Attending: Obstetrics and Gynecology

## 2016-05-24 ENCOUNTER — Ambulatory Visit: Payer: Managed Care, Other (non HMO) | Admitting: Anesthesiology

## 2016-05-24 ENCOUNTER — Encounter: Payer: Self-pay | Admitting: *Deleted

## 2016-05-24 ENCOUNTER — Ambulatory Visit
Admission: RE | Admit: 2016-05-24 | Discharge: 2016-05-24 | Disposition: A | Discharge status: Home / Self Care | Payer: Managed Care, Other (non HMO) | Source: Ambulatory Visit | Attending: Obstetrics and Gynecology | Admitting: Obstetrics and Gynecology

## 2016-05-24 DIAGNOSIS — Z30432 Encounter for removal of intrauterine contraceptive device: Secondary | ICD-10-CM | POA: Insufficient documentation

## 2016-05-24 DIAGNOSIS — G4730 Sleep apnea, unspecified: Secondary | ICD-10-CM | POA: Diagnosis not present

## 2016-05-24 DIAGNOSIS — K219 Gastro-esophageal reflux disease without esophagitis: Secondary | ICD-10-CM | POA: Diagnosis not present

## 2016-05-24 DIAGNOSIS — Z79899 Other long term (current) drug therapy: Secondary | ICD-10-CM | POA: Insufficient documentation

## 2016-05-24 DIAGNOSIS — F419 Anxiety disorder, unspecified: Secondary | ICD-10-CM | POA: Diagnosis not present

## 2016-05-24 DIAGNOSIS — Z302 Encounter for sterilization: Secondary | ICD-10-CM | POA: Diagnosis not present

## 2016-05-24 DIAGNOSIS — I10 Essential (primary) hypertension: Secondary | ICD-10-CM | POA: Insufficient documentation

## 2016-05-24 DIAGNOSIS — Z9851 Tubal ligation status: Secondary | ICD-10-CM

## 2016-05-24 DIAGNOSIS — Z87891 Personal history of nicotine dependence: Secondary | ICD-10-CM | POA: Diagnosis not present

## 2016-05-24 HISTORY — PX: LAPAROSCOPIC BILATERAL SALPINGECTOMY: SHX5889

## 2016-05-24 LAB — GLUCOSE, CAPILLARY
GLUCOSE-CAPILLARY: 103 mg/dL — AB (ref 65–99)
GLUCOSE-CAPILLARY: 133 mg/dL — AB (ref 65–99)

## 2016-05-24 LAB — POCT PREGNANCY, URINE: PREG TEST UR: NEGATIVE

## 2016-05-24 SURGERY — SALPINGECTOMY, BILATERAL, LAPAROSCOPIC
Anesthesia: General | Laterality: Bilateral | Wound class: Clean Contaminated

## 2016-05-24 MED ORDER — MIDAZOLAM HCL 2 MG/2ML IJ SOLN
INTRAMUSCULAR | Status: DC | PRN
  Administered 2016-05-24: 2 mg via INTRAVENOUS

## 2016-05-24 MED ORDER — SODIUM CHLORIDE 0.9 % IV SOLN
INTRAVENOUS | Status: DC
  Administered 2016-05-24 (×2): via INTRAVENOUS

## 2016-05-24 MED ORDER — FENTANYL CITRATE (PF) 100 MCG/2ML IJ SOLN
25.0000 ug | INTRAMUSCULAR | Status: DC | PRN
  Administered 2016-05-24 (×2): 25 ug via INTRAVENOUS
  Administered 2016-05-24 (×2): 50 ug via INTRAVENOUS

## 2016-05-24 MED ORDER — PROMETHAZINE HCL 25 MG/ML IJ SOLN
INTRAMUSCULAR | Status: AC
  Administered 2016-05-24: 6.25 mg via INTRAVENOUS
  Filled 2016-05-24: qty 1

## 2016-05-24 MED ORDER — NEOSTIGMINE METHYLSULFATE 10 MG/10ML IV SOLN
INTRAVENOUS | Status: DC | PRN
  Administered 2016-05-24: 5 mg via INTRAVENOUS

## 2016-05-24 MED ORDER — PROMETHAZINE HCL 25 MG/ML IJ SOLN
6.2500 mg | INTRAMUSCULAR | Status: DC | PRN
  Administered 2016-05-24: 6.25 mg via INTRAVENOUS

## 2016-05-24 MED ORDER — DEXAMETHASONE SODIUM PHOSPHATE 10 MG/ML IJ SOLN
INTRAMUSCULAR | Status: DC | PRN
  Administered 2016-05-24: 10 mg via INTRAVENOUS

## 2016-05-24 MED ORDER — ACETAMINOPHEN 10 MG/ML IV SOLN
INTRAVENOUS | Status: AC
Start: 2016-05-24 — End: 2016-05-24
  Filled 2016-05-24: qty 100

## 2016-05-24 MED ORDER — LACTATED RINGERS IV SOLN
INTRAVENOUS | Status: DC
Start: 2016-05-24 — End: 2016-05-24

## 2016-05-24 MED ORDER — PHENYLEPHRINE HCL 10 MG/ML IJ SOLN
INTRAMUSCULAR | Status: DC | PRN
  Administered 2016-05-24: 100 ug via INTRAVENOUS
  Administered 2016-05-24: 200 ug via INTRAVENOUS
  Administered 2016-05-24: 100 ug via INTRAVENOUS

## 2016-05-24 MED ORDER — SCOPOLAMINE 1 MG/3DAYS TD PT72
MEDICATED_PATCH | TRANSDERMAL | Status: AC
  Administered 2016-05-24: 1.5 mg via TRANSDERMAL
  Filled 2016-05-24: qty 1

## 2016-05-24 MED ORDER — ROCURONIUM BROMIDE 100 MG/10ML IV SOLN
INTRAVENOUS | Status: DC | PRN
  Administered 2016-05-24: 50 mg via INTRAVENOUS

## 2016-05-24 MED ORDER — OXYCODONE-ACETAMINOPHEN 5-325 MG PO TABS
ORAL_TABLET | ORAL | Status: AC
  Filled 2016-05-24: qty 1

## 2016-05-24 MED ORDER — ACETAMINOPHEN 10 MG/ML IV SOLN
INTRAVENOUS | Status: DC | PRN
  Administered 2016-05-24: 1000 mg via INTRAVENOUS

## 2016-05-24 MED ORDER — IBUPROFEN 800 MG PO TABS: 800.0000 mg | ORAL_TABLET | Freq: Three times a day (TID) | ORAL | 1 refills | Status: DC

## 2016-05-24 MED ORDER — PROPOFOL 10 MG/ML IV BOLUS
INTRAVENOUS | Status: DC | PRN
  Administered 2016-05-24: 200 mg via INTRAVENOUS

## 2016-05-24 MED ORDER — GLYCOPYRROLATE 0.2 MG/ML IJ SOLN
INTRAMUSCULAR | Status: DC | PRN
  Administered 2016-05-24: 0.6 mg via INTRAVENOUS

## 2016-05-24 MED ORDER — ONDANSETRON HCL 4 MG/2ML IJ SOLN
INTRAMUSCULAR | Status: DC | PRN
  Administered 2016-05-24: 4 mg via INTRAVENOUS

## 2016-05-24 MED ORDER — FENTANYL CITRATE (PF) 100 MCG/2ML IJ SOLN
INTRAMUSCULAR | Status: AC
  Administered 2016-05-24: 50 ug via INTRAVENOUS
  Filled 2016-05-24: qty 2

## 2016-05-24 MED ORDER — OXYCODONE-ACETAMINOPHEN 5-325 MG PO TABS
1.0000 | ORAL_TABLET | ORAL | Status: DC | PRN
  Administered 2016-05-24: 1 via ORAL

## 2016-05-24 MED ORDER — LIDOCAINE HCL (CARDIAC) 20 MG/ML IV SOLN
INTRAVENOUS | Status: DC | PRN
  Administered 2016-05-24: 100 mg via INTRAVENOUS

## 2016-05-24 MED ORDER — KETOROLAC TROMETHAMINE 30 MG/ML IJ SOLN
INTRAMUSCULAR | Status: DC | PRN
  Administered 2016-05-24: 30 mg via INTRAVENOUS

## 2016-05-24 MED ORDER — SCOPOLAMINE 1 MG/3DAYS TD PT72
1.0000 | MEDICATED_PATCH | Freq: Once | TRANSDERMAL | Status: DC
  Administered 2016-05-24: 1.5 mg via TRANSDERMAL

## 2016-05-24 MED ORDER — FENTANYL CITRATE (PF) 100 MCG/2ML IJ SOLN
INTRAMUSCULAR | Status: AC
  Administered 2016-05-24: 25 ug via INTRAVENOUS
  Filled 2016-05-24: qty 2

## 2016-05-24 MED ORDER — FENTANYL CITRATE (PF) 100 MCG/2ML IJ SOLN
INTRAMUSCULAR | Status: DC | PRN
  Administered 2016-05-24 (×3): 50 ug via INTRAVENOUS
  Administered 2016-05-24: 100 ug via INTRAVENOUS

## 2016-05-24 MED ORDER — OXYCODONE-ACETAMINOPHEN 5-325 MG PO TABS: 1.0000 | ORAL_TABLET | ORAL | 0 refills | Status: DC | PRN

## 2016-05-24 SURGICAL SUPPLY — 37 items
ANCHOR TIS RET SYS 235ML (MISCELLANEOUS) IMPLANT
BLADE SURG SZ11 CARB STEEL (BLADE) ×3 IMPLANT
CANISTER SUCT 1200ML W/VALVE (MISCELLANEOUS) ×3 IMPLANT
CATH ROBINSON RED A/P 16FR (CATHETERS) ×3 IMPLANT
CHLORAPREP W/TINT 26ML (MISCELLANEOUS) ×3 IMPLANT
DRSG TEGADERM 2-3/8X2-3/4 SM (GAUZE/BANDAGES/DRESSINGS) ×9 IMPLANT
GLOVE BIO SURGEON STRL SZ8 (GLOVE) ×6 IMPLANT
GLOVE INDICATOR 8.0 STRL GRN (GLOVE) ×6 IMPLANT
GOWN STRL REUS W/ TWL LRG LVL3 (GOWN DISPOSABLE) ×2 IMPLANT
GOWN STRL REUS W/ TWL XL LVL3 (GOWN DISPOSABLE) ×1 IMPLANT
GOWN STRL REUS W/TWL LRG LVL3 (GOWN DISPOSABLE) ×4
GOWN STRL REUS W/TWL XL LVL3 (GOWN DISPOSABLE) ×2
IRRIGATION STRYKERFLOW (MISCELLANEOUS) IMPLANT
IRRIGATOR STRYKERFLOW (MISCELLANEOUS)
IV LACTATED RINGERS 1000ML (IV SOLUTION) ×3 IMPLANT
KIT DISPOSABLE FALLOPE RING (Ring) ×3 IMPLANT
KIT PINK PAD W/HEAD ARE REST (MISCELLANEOUS) ×3
KIT PINK PAD W/HEAD ARM REST (MISCELLANEOUS) ×1 IMPLANT
KIT RM TURNOVER CYSTO AR (KITS) ×3 IMPLANT
LABEL OR SOLS (LABEL) ×3 IMPLANT
LIQUID BAND (GAUZE/BANDAGES/DRESSINGS) IMPLANT
NS IRRIG 1000ML POUR BTL (IV SOLUTION) ×3 IMPLANT
NS IRRIG 500ML POUR BTL (IV SOLUTION) ×3 IMPLANT
PACK GYN LAPAROSCOPIC (MISCELLANEOUS) ×3 IMPLANT
PAD OB MATERNITY 4.3X12.25 (PERSONAL CARE ITEMS) ×3 IMPLANT
PAD PREP 24X41 OB/GYN DISP (PERSONAL CARE ITEMS) ×3 IMPLANT
POUCH ENDO CATCH 10MM SPEC (MISCELLANEOUS) IMPLANT
SCISSORS METZENBAUM CVD 33 (INSTRUMENTS) IMPLANT
SHEARS HARMONIC ACE PLUS 36CM (ENDOMECHANICALS) IMPLANT
SLEEVE ENDOPATH XCEL 5M (ENDOMECHANICALS) IMPLANT
SUT MNCRL 4-0 (SUTURE) ×4
SUT MNCRL 4-0 27XMFL (SUTURE) ×2
SUT VIC AB 0 UR5 27 (SUTURE) ×3 IMPLANT
SUTURE MNCRL 4-0 27XMF (SUTURE) ×2 IMPLANT
TROCAR ENDO BLADELESS 11MM (ENDOMECHANICALS) IMPLANT
TROCAR XCEL NON-BLD 5MMX100MML (ENDOMECHANICALS) ×3 IMPLANT
TUBING INSUFFLATOR HI FLOW (MISCELLANEOUS) ×3 IMPLANT

## 2016-05-24 NOTE — Transfer of Care (Signed)
Immediate Anesthesia Transfer of Care Note  Patient: Julia Nunez  Procedure(s) Performed: Procedure(s): LAPAROSCOPIC BILATERAL TUBAL BANDING VIA FALOPE RINGS (Bilateral)  Patient Location: PACU  Anesthesia Type:General  Level of Consciousness: awake  Airway & Oxygen Therapy: Patient Spontanous Breathing  Post-op Assessment: Report given to RN  Post vital signs: stable  Last Vitals:  Vitals:   05/24/16 0617 05/24/16 0912  BP: 129/78 98/68  Pulse: 88 66  Resp: 16 16  Temp: (!) 35.9 C 37 C    Last Pain:  Vitals:   05/24/16 0617  TempSrc: Tympanic  PainSc: 0-No pain         Complications: No apparent anesthesia complications

## 2016-05-24 NOTE — Anesthesia Preprocedure Evaluation (Addendum)
Anesthesia Evaluation  Patient identified by MRN, date of birth, ID band Patient awake    Reviewed: Allergy & Precautions, H&P , NPO status , Patient's Chart, lab work & pertinent test results, reviewed documented beta blocker date and time   History of Anesthesia Complications (+) PONV, Family history of anesthesia reaction and history of anesthetic complications  Airway Mallampati: I  TM Distance: >3 FB Neck ROM: full    Dental  (+) Teeth Intact   Pulmonary sleep apnea , former smoker,    Pulmonary exam normal breath sounds clear to auscultation       Cardiovascular Exercise Tolerance: Good hypertension, + angina at rest (-) CAD, (-) Past MI, (-) Cardiac Stents and (-) 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) Normal cardiovascular exam(-) dysrhythmias (-) Valvular Problems/Murmurs Rhythm:regular Rate:Normal     Neuro/Psych negative neurological ROS  negative psych ROS   GI/Hepatic Neg liver ROS, GERD  Medicated,  Endo/Other  negative endocrine ROS  Renal/GU negative Renal ROS  negative genitourinary   Musculoskeletal   Abdominal   Peds  Hematology negative hematology ROS (+)   Anesthesia Other Findings Past Medical History:   Anxiety                                                      Hypertension                                                 Back pain                                                    Reproductive/Obstetrics negative OB ROS                             Anesthesia Physical  Anesthesia Plan  ASA: II  Anesthesia Plan: General   Post-op Pain Management:    Induction:   Airway Management Planned:   Additional Equipment:   Intra-op Plan:   Post-operative Plan:   Informed Consent: I have reviewed the patients History and Physical, chart, labs and discussed the procedure including the risks, benefits and alternatives for the proposed anesthesia with the patient or  authorized representative who has indicated his/her understanding and acceptance.   Dental Advisory Given  Plan Discussed with: Anesthesiologist, CRNA and Surgeon  Anesthesia Plan Comments:         Anesthesia Quick Evaluation

## 2016-05-24 NOTE — Anesthesia Procedure Notes (Signed)
Procedure Name: Intubation Date/Time: 05/24/2016 8:03 AM Performed by: Leander Rams Pre-anesthesia Checklist: Patient identified, Emergency Drugs available, Suction available, Patient being monitored and Timeout performed Patient Re-evaluated:Patient Re-evaluated prior to inductionOxygen Delivery Method: Circle system utilized Preoxygenation: Pre-oxygenation with 100% oxygen Intubation Type: IV induction Ventilation: Mask ventilation without difficulty Laryngoscope Size: Mac and 3 Grade View: Grade III Tube type: Oral Tube size: 7.0 mm Number of attempts: 1 Airway Equipment and Method: Stylet Placement Confirmation: positive ETCO2 and breath sounds checked- equal and bilateral Secured at: 22 cm Tube secured with: Tape Dental Injury: Teeth and Oropharynx as per pre-operative assessment  Difficulty Due To: Difficult Airway- due to anterior larynx and Difficulty was unanticipated Future Recommendations: Recommend- induction with short-acting agent, and alternative techniques readily available

## 2016-05-24 NOTE — Interval H&P Note (Signed)
History and Physical Interval Note:  05/24/2016 7:46 AM  Julia Nunez  has presented today for surgery, with the diagnosis of DESIRES STERILIZATION, DYSMENORRHEA  The various methods of treatment have been discussed with the patient and family. After consideration of risks, benefits and other options for treatment, the patient has consented to  Procedure(s): LAPAROSCOPIC BILATERAL SALPINGECTOMY (Bilateral) as a surgical intervention .  The patient's history has been reviewed, patient examined, no change in status, stable for surgery.  I have reviewed the patient's chart and labs.  Questions were answered to the patient's satisfaction.     Hassell Done A Darlyn Repsher

## 2016-05-24 NOTE — Discharge Instructions (Signed)

## 2016-05-24 NOTE — Op Note (Signed)
OPERATIVE NOTE:  Julia Nunez PROCEDURE DATE: 05/24/2016   PREOPERATIVE DIAGNOSIS:  1. Desires elective sterilization  POSTOPERATIVE DIAGNOSIS:  1. Desires elective sterilization  PROCEDURE:  1. Removal of IUD 2. Laparoscopic bilateral tubal banding with Falope-Rings   SURGEON:  Brayton Mars, MD ASSISTANTS: PA-S Alla German ANESTHESIA: General INDICATIONS: 45 y.o. [id number] presents for surgical sterilization. She does not desire reversible methods of contraception. IUD needs to be removed.  FINDINGS:   Gynecoid pelvis IUD removed-normal Laparoscopy demonstrates normal fallopian tubes and ovaries, normal uterus   I/O's: Total I/O In: 800 [I.V.:800] Out: -  COUNTS:  YES SPECIMENS:  IUD for gross inspection only  ANTIBIOTIC PROPHYLAXIS:N/A COMPLICATIONS: None immediate  PROCEDURE IN DETAIL: Patient was brought to the operating room placed in supine position. General endotracheal anesthesia was induced without difficulty. He was placed in the dorsal lithotomy position using the bumblebee stirrups. A ChloraPrep and Hibiclens abdominal perineal intravaginal prep and drape was fashioned. Timeout was completed. Red Robinson catheter was used to drain 150 mL of urine from water. Large weighted speculum was placed in the vagina and cervix was grasped at 12:00 with single-tooth tenaculum. The IUD string was grasped with a forcep and removed. IUD was sent to pathology for gross inspection. TENACULUM WAS PLACED ON THE ANTERIOR LIP OF THE CERVIX. Laparoscopy was performed in standard fashion. Direct entry could not be made because of redundant peritoneum. The 5 mm subumbilical incision was extended to centimeter and the preperitoneal fat was dissected down to the fascia. Fascia was grasped with a clamps. Direct entry was then made the 5 mm Optiview laparoscopic trocar system eventually abdominal pelvic cavity without evidence of bowel or vascular injury. Pneumoperitoneum was created  with CO2 gas. 8 mm port was placed in the suprapubic region under direct visualization. The above-noted findings were made. The Falope-Rings were then placed using the Falope ring applicator in standard fashion on the left and right fallopian tubes with good knuckle of tube being obtained. Once satisfied with inspection of the pelvis for procedure are with all instrumentation being removed from the abdominal pelvic cavity. Pneumoperitoneum was released. Incisions were closed with 4-0 Monocryl suture. Subcuticular manner. Dermabond glue was placed over the incisions. Patient was then awakened and taken to the recovery room in satisfactory condition   [name] A. [name], MD, ACOG [facility]

## 2016-05-24 NOTE — Anesthesia Postprocedure Evaluation (Signed)
Anesthesia Post Note  Patient: Julia Nunez  Procedure(s) Performed: Procedure(s) (LRB): LAPAROSCOPIC BILATERAL TUBAL BANDING VIA FALOPE RINGS (Bilateral)  Patient location during evaluation: PACU Anesthesia Type: General Level of consciousness: awake and alert Pain management: pain level controlled Vital Signs Assessment: post-procedure vital signs reviewed and stable Respiratory status: spontaneous breathing, nonlabored ventilation and respiratory function stable Cardiovascular status: blood pressure returned to baseline and stable Postop Assessment: no signs of nausea or vomiting Anesthetic complications: no    Last Vitals:  Vitals:   05/24/16 1005 05/24/16 1034  BP: 112/70 120/66  Pulse: 63   Resp: 15 16  Temp: 37.1 C     Last Pain:  Vitals:   05/24/16 1005  TempSrc:   PainSc: 8                  Martha Clan

## 2016-05-24 NOTE — H&P (View-Only) (Signed)
Subjective:  PREOPERATIVE HISTORY AND PHYSICAL   Date of surgery: 05/24/2016 Procedure: Laparoscopic bilateral tubal banding Falope-Rings   Patient is a 45 y.o. [id number] scheduled for laparoscopic bilateral tubal banding with Falope-Rings for permanent sterilization.   Pertinent Gynecological History: Patient's last menstrual period was 03/17/2016 (approximate). Contraception: IUD Last Pap: Negative Last mammogram: BI-RADS 1 01/16/2016   Menstrual History: OB History    Gravida Para Term Preterm AB Living   3 2 2   1 2    SAB TAB Ectopic Multiple Live Births   1       2      Menarche age:na Patient's last menstrual period was 05/15/2016 (exact date).    Past Medical History:  Diagnosis Date  . Anxiety   . Back pain   . Hypertension     Past Surgical History:  Procedure Laterality Date  . CESAREAN SECTION  1999   X1  . COLONOSCOPY WITH PROPOFOL N/A 03/27/2015   Procedure: COLONOSCOPY WITH PROPOFOL;  Surgeon: Manya Silvas, MD;  Location: [facility] ENDOSCOPY;  Service: Endoscopy;  Laterality: N/A;  . CYSTECTOMY     SEBACEOUS CYST ON SCALP  . ESOPHAGOGASTRODUODENOSCOPY N/A 03/27/2015   Procedure: ESOPHAGOGASTRODUODENOSCOPY (EGD);  Surgeon: Manya Silvas, MD;  Location: [facility] ENDOSCOPY;  Service: Endoscopy;  Laterality: N/A;  . SAVORY DILATION N/A 03/27/2015   Procedure: SAVORY DILATION;  Surgeon: Manya Silvas, MD;  Location[facility] ENDOSCOPY;  Service: Endoscopy;  Laterality: N/A;    OB History  Gravida Para Term Preterm AB Living  3 2 2   1 2   SAB TAB Ectopic Multiple Live Births  1       2    # Outcome Date GA Lbr Len/2nd Weight Sex Delivery Anes PTL Lv  3 Term 1999   8 lb 6.4 oz (3.81 kg) F CS-LTranv   LIV  2 SAB 1994          1 Term 1992   9 lb 2.1 oz (4.141 kg) F Vag-Spont   LIV      Social History   Social History  . Marital status: Married    Spouse name: N/A  . Number of children: N/A  . Years of education: N/A   Social History Main Topics  .  Smoking status: Former Research scientist (life sciences)  . Smokeless tobacco: Never Used  . Alcohol use 0.0 oz/week     Comment: OCCASIONALLY  . Drug use: No  . Sexual activity: Yes    Birth control/ protection: IUD     Comment: paragard   Other Topics Concern  . None   Social History Narrative  . None    Family History  Problem Relation Age of Onset  . Hypertension Father   . Diabetes Father   . Breast cancer Neg Hx      (Not in a hospital admission)  Allergies  Allergen Reactions  . Hydrocodone-Acetaminophen Nausea Only    Review of Systems Constitutional: No recent fever/chills/sweats Respiratory: No recent cough/bronchitis Cardiovascular: No chest pain Gastrointestinal: No recent nausea/vomiting/diarrhea Genitourinary: No UTI symptoms Hematologic/lymphatic:No history of coagulopathy or recent blood thinner use    Objective:    BP 125/83   Pulse 92   Ht 5\' 7"  (1.702 m)   Wt 235 lb 11.2 oz (106.9 kg)   LMP 05/15/2016 (Exact Date)   BMI 36.92 kg/m   General:   Normal  Skin:   normal  HEENT:  Normal  Neck:  Supple without Adenopathy or Thyromegaly  Lungs:  Heart:              Breasts:   Abdomen:  Pelvis:  M/S   Extremeties:  Neuro:    clear to auscultation bilaterally   Normal without murmur   Not Examined   soft, non-tender; bowel sounds normal; no masses,  no organomegaly   Exam deferred to OR  No CVAT  Warm/Dry   Normal          Assessment:    Desires permanent sterilization   Plan:   Laparoscopic bilateral tubal banding with Falope-Rings  Preoperative counseling: Patient is to undergo sterilization procedure on 05/24/2016. She is understanding of the planned procedure and is aware of and is accepting of all surgical risks which include but are not limited to bleeding, infection, pelvic organ injury with need for repair, blood clot disorders, anesthesia risks, failure rate of 1 out of 300, etc. All questions have been answered. Informed consent is given.  Patient is ready and willing to proceed with surgery as scheduled.  Brayton Mars, MD  Note: This dictation was prepared with Dragon dictation along with smaller phrase technology. Any transcriptional errors that result from this process are unintentional.

## 2016-05-25 LAB — SURGICAL PATHOLOGY

## 2016-05-26 ENCOUNTER — Other Ambulatory Visit: Payer: Self-pay

## 2016-05-27 ENCOUNTER — Other Ambulatory Visit: Payer: Self-pay | Admitting: *Deleted

## 2016-05-27 MED ORDER — BUPROPION HCL ER (XL) 150 MG PO TB24: 150.0000 mg | ORAL_TABLET | Freq: Every day | ORAL | 4 refills | Status: DC

## 2016-06-02 ENCOUNTER — Encounter: Payer: Self-pay | Admitting: Obstetrics and Gynecology

## 2016-06-02 ENCOUNTER — Ambulatory Visit (INDEPENDENT_AMBULATORY_CARE_PROVIDER_SITE_OTHER): Payer: Managed Care, Other (non HMO) | Admitting: Obstetrics and Gynecology

## 2016-06-02 VITALS — BP 140/71 | HR 88 | Wt 233.6 lb

## 2016-06-02 DIAGNOSIS — Z09 Encounter for follow-up examination after completed treatment for conditions other than malignant neoplasm: Secondary | ICD-10-CM

## 2016-06-02 DIAGNOSIS — Z9851 Tubal ligation status: Secondary | ICD-10-CM | POA: Insufficient documentation

## 2016-06-02 NOTE — Progress Notes (Signed)
Chief complaint: 1. Postop check  Patient presents for 1 week postop check all in laparoscopic bilateral tubal banding with Falope-Rings. She is doing well with normal bowel and bladder function. She is not experiencing any significant pelvic pain.  IUD was removed at the time of surgery.  OBJECTIVE: BP 140/71   Pulse 88   Wt 233 lb 9.6 oz (106 kg)   LMP 05/15/2016 (Exact Date)   BMI 36.59 kg/m  Pleasant well-appearing white female in no acute distress Abdomen: Soft, nontender without organomegaly; small area of ecchymoses and left lower quadrant with" blood blister"; no evidence of infection Pelvic exam: Deferred  ASSESSMENT: 1. Normal postop check 2. Status post laparoscopic bilateral tubal banding with Falope-Rings  PLAN: 1. Resume all activities as tolerated 2. Return for routine wellness exams with Melody Shambley as scheduled  Brayton Mars, MD  Note: This dictation was prepared with Dragon dictation along with smaller phrase technology. Any transcriptional errors that result from this process are unintentional.

## 2016-06-02 NOTE — Patient Instructions (Signed)
1. Resume activities as tolerated 2. Follow-up with [facility] as needed for routine wellness exams

## 2016-06-06 IMAGING — MR MR CERVICAL SPINE W/O CM
5 series · 33 of 48 positions shown · non-contrast
Comparison: None.

CLINICAL DATA: Cervical radiculopathy. Left shoulder and arm pain
and numbness.

EXAM:
MRI CERVICAL SPINE WITHOUT CONTRAST
TECHNIQUE: Multiplanar, multisequence MR imaging of the cervical spine was
performed. No intravenous contrast was administered.

[Series 2: T2 · sagittal · 3.0mm · 0.56mm/px · 6 of 13 slices shown (1 of 2)]
[im 1/13]
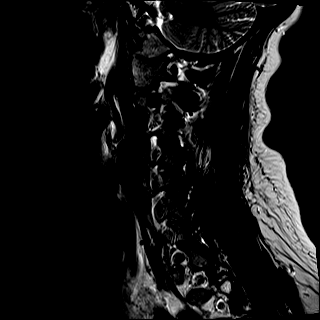
[im 3/13]
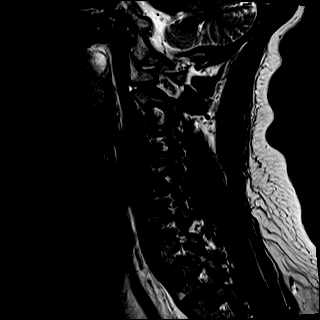
[im 5/13]
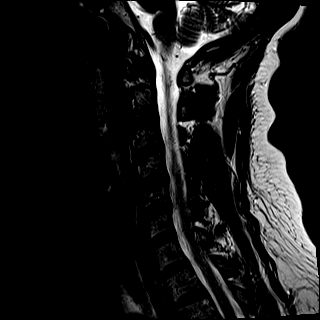
[im 8/13]
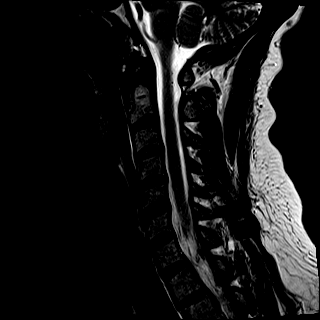
[im 10/13]
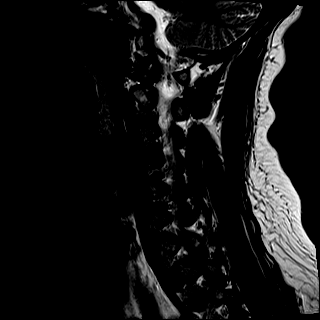
[im 13/13]
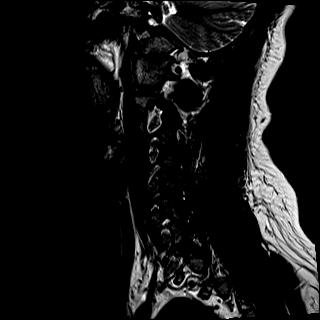

[Series 3: T1 · sagittal · 3.0mm · 0.56mm/px · 7 of 13 slices shown]
[im 1/13]
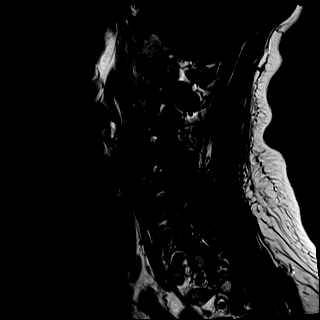
[im 3/13]
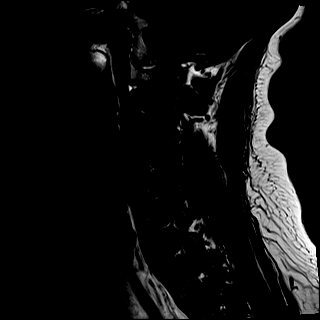
[im 5/13]
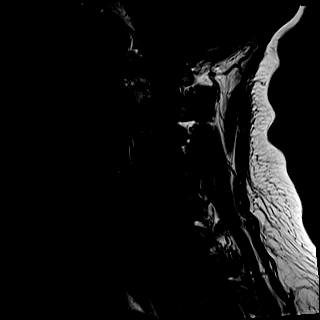
[im 7/13]
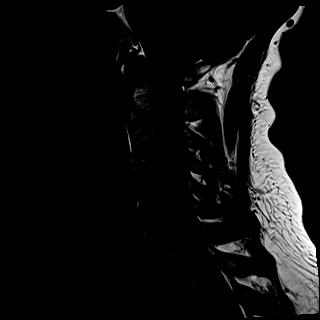
[im 9/13]
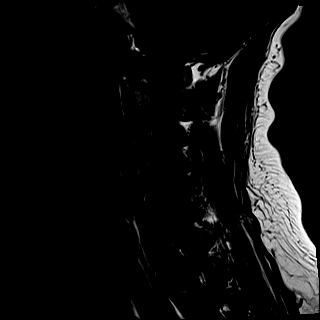
[im 11/13]
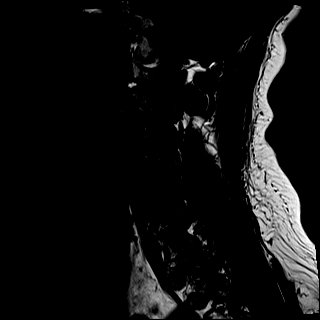
[im 13/13]
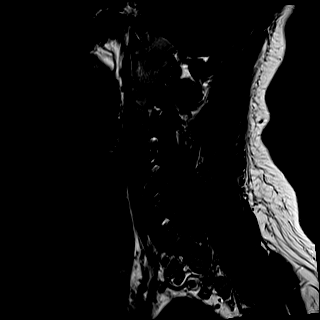

[Series 4: STIR · sagittal · 3.0mm · 0.35mm/px · 7 of 13 slices shown]
[im 1/13]
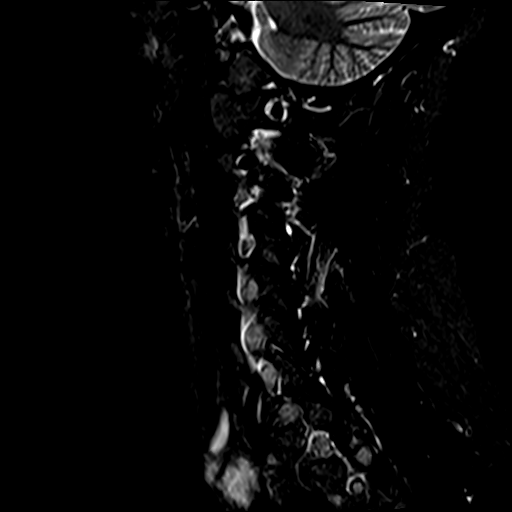
[im 3/13]
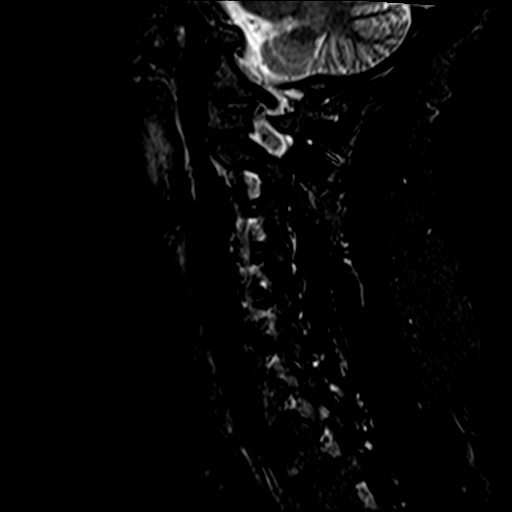
[im 5/13]
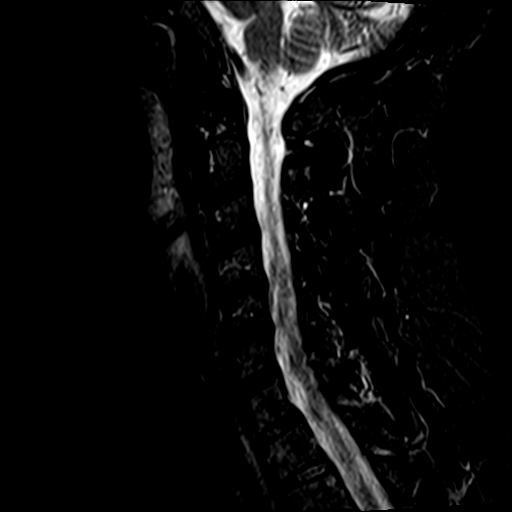
[im 7/13]
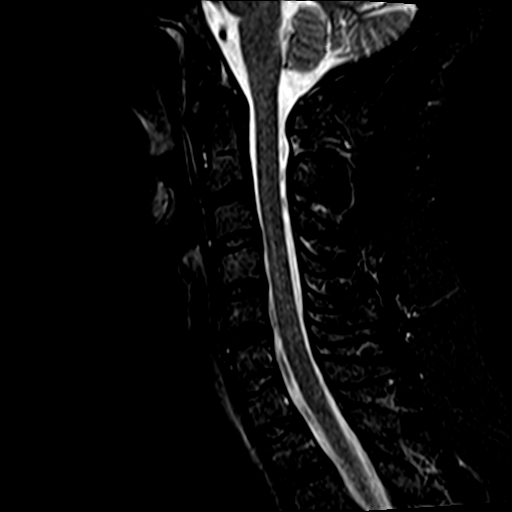
[im 9/13]
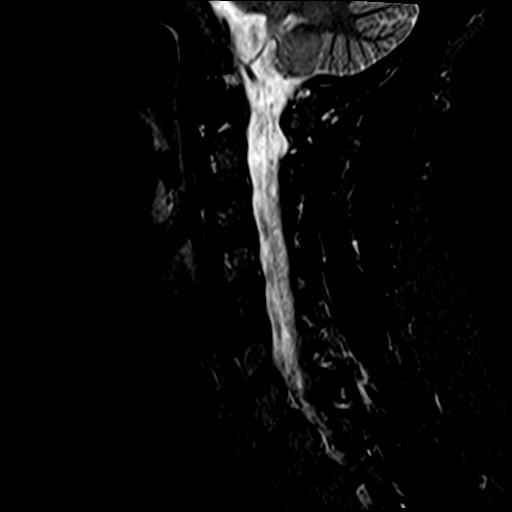
[im 11/13]
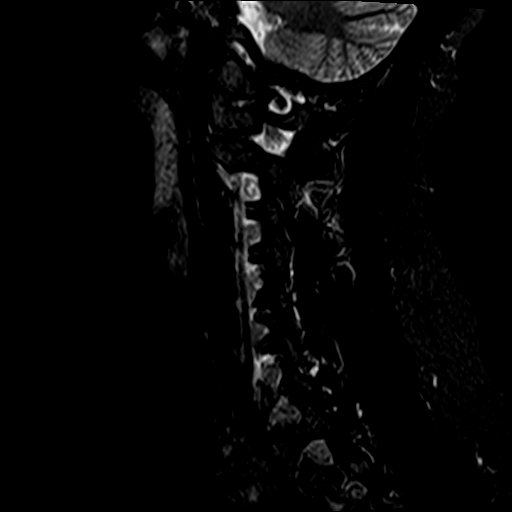
[im 13/13]
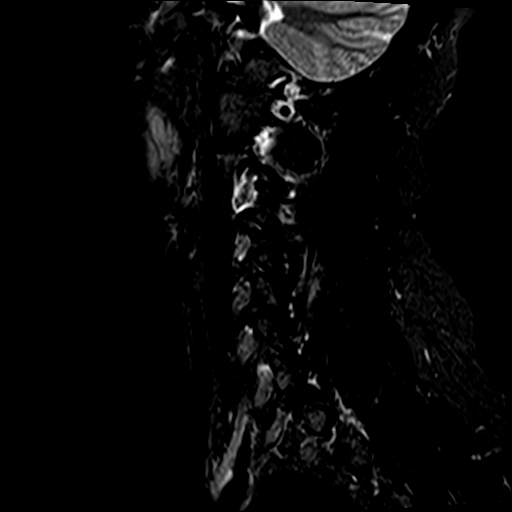

[Series 5: T2 · axial · 3.0mm · 0.62mm/px · z∈[-102,-0]mm · 8 of 28 slices shown (2 of 2)]
[im 1/28]
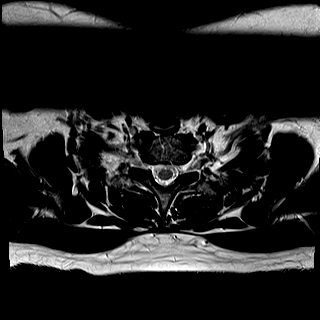
[im 5/28]
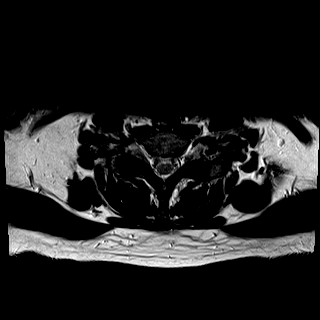
[im 9/28]
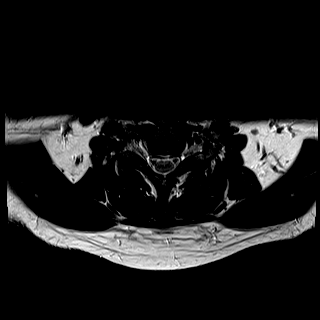
[im 13/28]
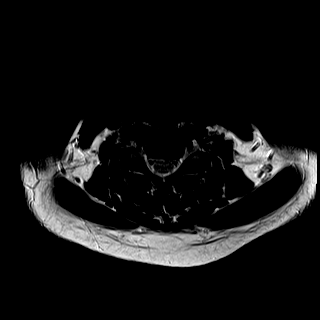
[im 15/28]
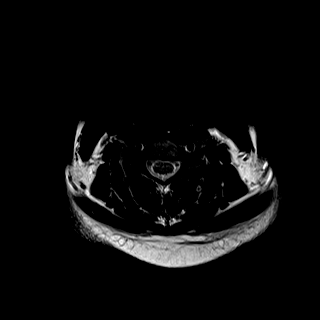
[im 19/28]
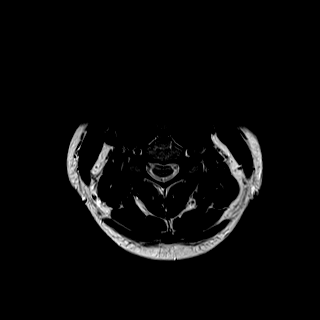
[im 23/28]
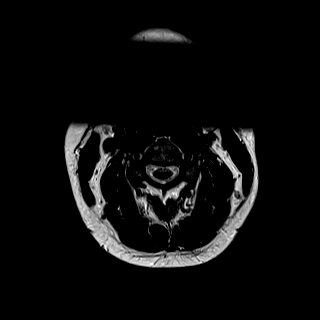
[im 28/28]
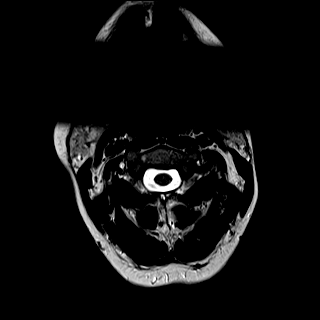

[Series 6: mpgr ax · axial · 3.0mm · 0.35mm/px · z∈[-93,-41]mm · 5 of 28 slices shown]
[im 1/28]
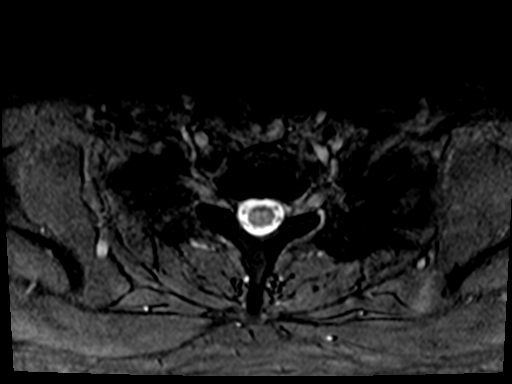
[im 5/28]
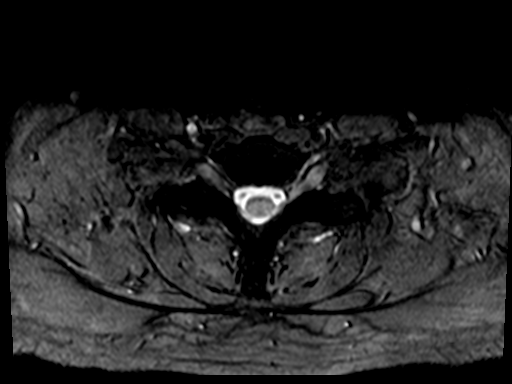
[im 9/28]
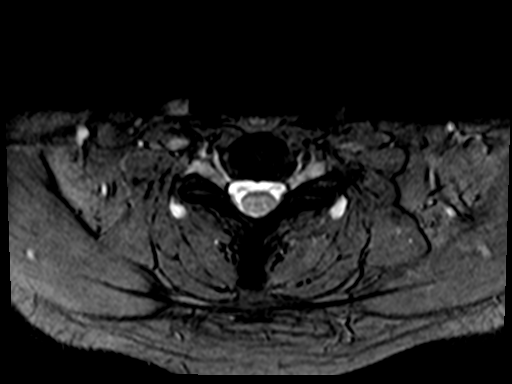
[im 13/28]
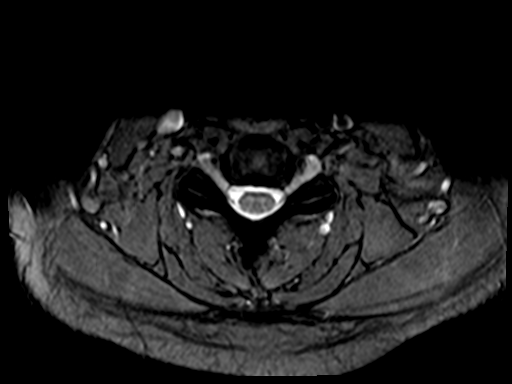
[im 15/28]
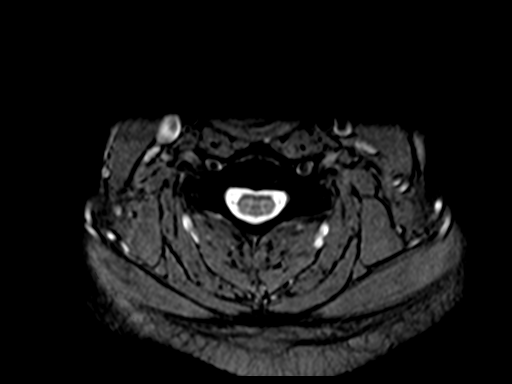

[33 of 48 positions shown; findings below may reference images not displayed]

FINDINGS: Normal cervical alignment. Negative for fracture or mass. No cord
compression. Spinal cord signal is normal. Craniocervical junction
normal.

C2-3:  Negative

C3-4:  Tiny central disc protrusion without stenosis

C4-5: Tiny central disc protrusion without stenosis. Early facet
degeneration on the left.

C5-6: Mild disc degeneration. Small uncinate spur on the left
without significant stenosis

C6-7:  Negative

C7-T1:  Negative
IMPRESSION: Mild cervical degenerative changes as above. Negative for neural
impingement or stenosis.

## 2016-06-11 ENCOUNTER — Encounter: Payer: Self-pay | Admitting: Obstetrics and Gynecology

## 2016-06-24 ENCOUNTER — Encounter: Payer: Self-pay | Admitting: Obstetrics and Gynecology

## 2016-06-24 ENCOUNTER — Other Ambulatory Visit: Payer: Self-pay | Admitting: *Deleted

## 2016-06-24 MED ORDER — BUPROPION HCL ER (XL) 150 MG PO TB24: 150.0000 mg | ORAL_TABLET | Freq: Every day | ORAL | 4 refills | Status: DC

## 2016-07-02 ENCOUNTER — Other Ambulatory Visit: Payer: Self-pay | Admitting: Obstetrics and Gynecology

## 2016-07-02 ENCOUNTER — Encounter: Payer: Self-pay | Admitting: Obstetrics and Gynecology

## 2016-07-02 MED ORDER — ZOLPIDEM TARTRATE 10 MG PO TABS: 10.0000 mg | ORAL_TABLET | Freq: Every day | ORAL | 1 refills | Status: DC

## 2016-07-02 MED ORDER — BUPROPION HCL ER (XL) 150 MG PO TB24: 150.0000 mg | ORAL_TABLET | Freq: Every day | ORAL | 0 refills | Status: DC

## 2016-08-13 ENCOUNTER — Other Ambulatory Visit: Payer: Self-pay | Admitting: *Deleted

## 2016-08-13 MED ORDER — VITAMIN D (ERGOCALCIFEROL) 1.25 MG (50000 UNIT) PO CAPS: 50000.0000 [IU] | ORAL_CAPSULE | ORAL | 4 refills | Status: DC

## 2016-09-15 ENCOUNTER — Other Ambulatory Visit: Payer: Self-pay | Admitting: *Deleted

## 2016-09-15 ENCOUNTER — Encounter: Payer: Self-pay | Admitting: Obstetrics and Gynecology

## 2016-09-15 MED ORDER — ZOLPIDEM TARTRATE 10 MG PO TABS: 10.0000 mg | ORAL_TABLET | Freq: Every day | ORAL | 2 refills | Status: DC

## 2016-09-15 MED ORDER — VITAMIN D (ERGOCALCIFEROL) 1.25 MG (50000 UNIT) PO CAPS: 50000.0000 [IU] | ORAL_CAPSULE | ORAL | 4 refills | Status: AC

## 2016-09-15 MED ORDER — HYDROCHLOROTHIAZIDE 25 MG PO TABS: 25.0000 mg | ORAL_TABLET | Freq: Every day | ORAL | 4 refills | Status: DC

## 2016-09-15 MED ORDER — BUPROPION HCL ER (XL) 150 MG PO TB24: 150.0000 mg | ORAL_TABLET | Freq: Every day | ORAL | 4 refills | Status: DC

## 2016-12-29 ENCOUNTER — Encounter: Payer: Managed Care, Other (non HMO) | Admitting: Obstetrics and Gynecology

## 2016-12-30 ENCOUNTER — Encounter: Payer: Self-pay | Admitting: Obstetrics and Gynecology

## 2017-01-03 ENCOUNTER — Other Ambulatory Visit: Payer: Self-pay | Admitting: *Deleted

## 2017-01-03 MED ORDER — ZOLPIDEM TARTRATE 10 MG PO TABS: 10.0000 mg | ORAL_TABLET | Freq: Every day | ORAL | 2 refills | Status: DC

## 2017-01-06 ENCOUNTER — Encounter: Payer: Managed Care, Other (non HMO) | Admitting: Obstetrics and Gynecology

## 2017-01-20 ENCOUNTER — Ambulatory Visit (INDEPENDENT_AMBULATORY_CARE_PROVIDER_SITE_OTHER): Payer: Managed Care, Other (non HMO) | Admitting: Obstetrics and Gynecology

## 2017-01-20 ENCOUNTER — Encounter: Payer: Self-pay | Admitting: Obstetrics and Gynecology

## 2017-01-20 VITALS — BP 118/80 | HR 88 | Ht 67.0 in | Wt 234.0 lb

## 2017-01-20 DIAGNOSIS — Z01419 Encounter for gynecological examination (general) (routine) without abnormal findings: Secondary | ICD-10-CM

## 2017-01-20 MED ORDER — BUPROPION HCL ER (XL) 150 MG PO TB24: 300.0000 mg | ORAL_TABLET | Freq: Every day | ORAL | 4 refills | Status: DC

## 2017-01-20 MED ORDER — ALPRAZOLAM 0.5 MG PO TABS: 0.5000 mg | ORAL_TABLET | Freq: Three times a day (TID) | ORAL | 2 refills | Status: DC | PRN

## 2017-01-20 NOTE — Patient Instructions (Signed)
Preventive Care 18-39 Years, Female Preventive care refers to lifestyle choices and visits with your health care provider that can promote health and wellness. What does preventive care include?  A yearly physical exam. This is also called an annual well check.  Dental exams once or twice a year.  Routine eye exams. Ask your health care provider how often you should have your eyes checked.  Personal lifestyle choices, including: ? Daily care of your teeth and gums. ? Regular physical activity. ? Eating a healthy diet. ? Avoiding tobacco and drug use. ? Limiting alcohol use. ? Practicing safe sex. ? Taking vitamin and mineral supplements as recommended by your health care provider. What happens during an annual well check? The services and screenings done by your health care provider during your annual well check will depend on your age, overall health, lifestyle risk factors, and family history of disease. Counseling Your health care provider may ask you questions about your:  Alcohol use.  Tobacco use.  Drug use.  Emotional well-being.  Home and relationship well-being.  Sexual activity.  Eating habits.  Work and work Statistician.  Method of birth control.  Menstrual cycle.  Pregnancy history.  Screening You may have the following tests or measurements:  Height, weight, and BMI.  Diabetes screening. This is done by checking your blood sugar (glucose) after you have not eaten for a while (fasting).  Blood pressure.  Lipid and cholesterol levels. These may be checked every 5 years starting at age 38.  Skin check.  Hepatitis C blood test.  Hepatitis B blood test.  Sexually transmitted disease (STD) testing.  BRCA-related cancer screening. This may be done if you have a family history of breast, ovarian, tubal, or peritoneal cancers.  Pelvic exam and Pap test. This may be done every 3 years starting at age 38. Starting at age 30, this may be done  every 5 years if you have a Pap test in combination with an HPV test.  Discuss your test results, treatment options, and if necessary, the need for more tests with your health care provider. Vaccines Your health care provider may recommend certain vaccines, such as:  Influenza vaccine. This is recommended every year.  Tetanus, diphtheria, and acellular pertussis (Tdap, Td) vaccine. You may need a Td booster every 10 years.  Varicella vaccine. You may need this if you have not been vaccinated.  HPV vaccine. If you are 39 or younger, you may need three doses over 6 months.  Measles, mumps, and rubella (MMR) vaccine. You may need at least one dose of MMR. You may also need a second dose.  Pneumococcal 13-valent conjugate (PCV13) vaccine. You may need this if you have certain conditions and were not previously vaccinated.  Pneumococcal polysaccharide (PPSV23) vaccine. You may need one or two doses if you smoke cigarettes or if you have certain conditions.  Meningococcal vaccine. One dose is recommended if you are age 68-21 years and a first-year college student living in a residence hall, or if you have one of several medical conditions. You may also need additional booster doses.  Hepatitis A vaccine. You may need this if you have certain conditions or if you travel or work in places where you may be exposed to hepatitis A.  Hepatitis B vaccine. You may need this if you have certain conditions or if you travel or work in places where you may be exposed to hepatitis B.  Haemophilus influenzae type b (Hib) vaccine. You may need this  if you have certain risk factors.  Talk to your health care provider about which screenings and vaccines you need and how often you need them. This information is not intended to replace advice given to you by your health care provider. Make sure you discuss any questions you have with your health care provider. Document Released: 09/07/2001 Document Revised:  03/31/2016 Document Reviewed: 05/13/2015 Elsevier Interactive Patient Education  2017 Elsevier Inc.  

## 2017-01-20 NOTE — Progress Notes (Signed)
Subjective:   Julia Nunez is a 46 y.o. G19P2012 Caucasian female here for a routine well-woman exam.  Patient's last menstrual period was 01/09/2017.    Current complaints: umbilicus drainage and itches intermittent since BTL. Uses neosporin to help. 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  Past Medical History Past Medical History:  Diagnosis Date  . Anxiety   . Back pain   . Complication of anesthesia   . Family history of adverse reaction to anesthesia    DAD-NAUSEATED   . GERD (gastroesophageal reflux disease)   . Headache    H/O MIGRAINES  . Hypertension   . PONV (postoperative nausea and vomiting)    NAUSEATED  . Sleep apnea    CPAP    Past Surgical History Past Surgical History:  Procedure Laterality Date  . CESAREAN SECTION  1999   X1  . COLONOSCOPY WITH PROPOFOL N/A 03/27/2015   Procedure: COLONOSCOPY WITH PROPOFOL;  Surgeon: Manya Silvas, MD;  Location: [facility] ENDOSCOPY;  Service: Endoscopy;  Laterality: N/A;  . CYSTECTOMY     SEBACEOUS CYST ON SCALP  . CYSTOSCOPY    . ESOPHAGOGASTRODUODENOSCOPY N/A 03/27/2015   Procedure: ESOPHAGOGASTRODUODENOSCOPY (EGD);  Surgeon: Manya Silvas, MD;  Location: [facility] ENDOSCOPY;  Service: Endoscopy;  Laterality: N/A;  . LAPAROSCOPIC BILATERAL SALPINGECTOMY Bilateral 05/24/2016   Procedure: LAPAROSCOPIC BILATERAL TUBAL BANDING VIA FALOPE RINGS;  Surgeon: Brayton Mars, MD;  Location: [facility] ORS;  Service: Gynecology;  Laterality: Bilateral;  . SAVORY DILATION N/A 03/27/2015   Procedure: SAVORY  DILATION;  Surgeon: Manya Silvas, MD;  Location: [facility] ENDOSCOPY;  Service: Endoscopy;  Laterality: N/A;    Gynecologic History [id number]  Patient's last menstrual period was 01/09/2017. Contraception: tubal ligation Last Pap: 06/17. Results were: normal Last mammogram: 12/2015. Results were: abnormal, with normal f/u scan and u/s   Obstetric History OB History  Gravida Para Term Preterm AB Living  3 2 2   1 2   SAB TAB Ectopic Multiple Live Births  1       2    # Outcome Date GA Lbr Len/2nd Weight Sex Delivery Anes PTL Lv  3 Term 1999   8 lb 6.4 oz (3.81 kg) F CS-LTranv   LIV  2 SAB 1994          1 Term 1992   9 lb 2.1 oz (4.141 kg) F Vag-Spont   LIV      Current Medications Current Outpatient Prescriptions on File Prior to Visit  Medication Sig Dispense Refill  . buPROPion (WELLBUTRIN XL) 150 MG 24 hr tablet Take 1 tablet (150 mg total) by mouth at bedtime. 90 tablet 4  . esomeprazole (NEXIUM) 40 MG capsule Take 40 mg by mouth daily before breakfast.     . hydrochlorothiazide (HYDRODIURIL) 25 MG tablet Take 1 tablet (25 mg total) by mouth daily. 90 tablet 4  . ibuprofen (ADVIL,MOTRIN) 800 MG tablet Take 1 tablet (800 mg total) by mouth 3 (three) times daily. 50 tablet 1  . vitamin B-12 (CYANOCOBALAMIN) 1000 MCG tablet Take 1,000 mcg by mouth daily.    . Vitamin D, Ergocalciferol, (  DRISDOL) 50000 units CAPS capsule Take 1 capsule (50,000 Units total) by mouth every 7 (seven) days. 12 capsule 4  . zolpidem (AMBIEN) 10 MG tablet Take 1 tablet (10 mg total) by mouth at bedtime. 30 tablet 2  . magnesium gluconate (MAGONATE) 500 MG tablet Take 500 mg by mouth at bedtime.    . Melatonin 5 MG TABS Take 5 mg by mouth at bedtime.    Marland Kitchen oxyCODONE-acetaminophen (ROXICET) 5-325 MG tablet Take 1-2 tablets by mouth every 4 (four) hours as needed for moderate pain or severe pain. (Patient not taking: Reported on 01/20/2017) 30 tablet 0   No current facility-administered medications on file  prior to visit.     Review of Systems Patient denies any headaches, blurred vision, shortness of breath, chest pain, abdominal pain, problems with bowel movements, urination, or intercourse.  Objective:  BP 118/80   Pulse 88   Ht 5\' 7"  (1.702 m)   Wt 234 lb (106.1 kg)   LMP 01/09/2017   BMI 36.65 kg/m  Physical Exam  General:  Well developed, well nourished, no acute distress. She is alert and oriented x3. Skin:  Warm and dry Neck:  Midline trachea, no thyromegaly or nodules Cardiovascular: Regular rate and rhythm, no murmur heard Lungs:  Effort normal, all lung fields clear to auscultation bilaterally Breasts:  No dominant palpable mass, retraction, or nipple discharge Abdomen:  Soft, non tender, no hepatosplenomegaly or masses, mild redness noted in umbilicus Pelvic:  External genitalia is normal in appearance.  The vagina is normal in appearance. The cervix is bulbous, no CMT.  Thin prep pap is not done. Uterus is felt to be normal size, shape, and contour.  No adnexal masses or tenderness noted. Extremities:  No swelling or varicosities noted Psych:  She has a normal mood and affect  Assessment:   Healthy well-woman exam Obesity Depression on SSRI Vitamin d deficiency Pre-diabetes    Plan:  meds refilled and increased wellbutrin to 1-2 tablets for increasing as needed,   F/U 1 year for AE, or sooner if needed Mammogram ordered or sooner if problems(needs 3D)   Amira Podolak Rockney Ghee, CNM

## 2017-02-02 ENCOUNTER — Ambulatory Visit
Admission: RE | Admit: 2017-02-02 | Discharge: 2017-02-02 | Disposition: A | Discharge status: Home / Self Care | Payer: 59 | Source: Ambulatory Visit | Attending: Obstetrics and Gynecology | Admitting: Obstetrics and Gynecology

## 2017-02-02 DIAGNOSIS — Z1231 Encounter for screening mammogram for malignant neoplasm of breast: Secondary | ICD-10-CM | POA: Insufficient documentation

## 2017-02-02 DIAGNOSIS — Z01419 Encounter for gynecological examination (general) (routine) without abnormal findings: Secondary | ICD-10-CM

## 2017-04-04 ENCOUNTER — Encounter: Payer: Self-pay | Admitting: Obstetrics and Gynecology

## 2017-04-05 ENCOUNTER — Other Ambulatory Visit: Payer: Self-pay | Admitting: *Deleted

## 2017-04-05 MED ORDER — CLOBETASOL PROPIONATE 0.05 % EX CREA: 1.0000 "application " | TOPICAL_CREAM | Freq: Two times a day (BID) | CUTANEOUS | 3 refills | Status: DC

## 2017-05-02 ENCOUNTER — Other Ambulatory Visit: Payer: Self-pay | Admitting: *Deleted

## 2017-05-02 ENCOUNTER — Encounter: Payer: Self-pay | Admitting: Obstetrics and Gynecology

## 2017-05-02 MED ORDER — ZOLPIDEM TARTRATE 10 MG PO TABS: 10.0000 mg | ORAL_TABLET | Freq: Every day | ORAL | 2 refills | Status: DC

## 2017-05-03 ENCOUNTER — Encounter: Payer: Self-pay | Admitting: *Deleted

## 2017-05-03 ENCOUNTER — Other Ambulatory Visit: Payer: Self-pay | Admitting: Obstetrics and Gynecology

## 2017-05-31 ENCOUNTER — Encounter: Payer: Self-pay | Admitting: Obstetrics and Gynecology

## 2017-06-06 ENCOUNTER — Encounter: Payer: Self-pay | Admitting: *Deleted

## 2017-06-21 ENCOUNTER — Telehealth: Payer: Self-pay | Admitting: Obstetrics and Gynecology

## 2017-06-21 NOTE — Telephone Encounter (Signed)
The patient called and stated that she needs to have Julia Nunez/Mel fill out a form to prove that she has had her annual exam, No other information was disclosed. Please advise.

## 2017-06-22 ENCOUNTER — Encounter: Payer: Self-pay | Admitting: Obstetrics and Gynecology

## 2017-06-22 NOTE — Telephone Encounter (Signed)
Fax went through to work place

## 2017-06-23 ENCOUNTER — Other Ambulatory Visit
Admission: RE | Admit: 2017-06-23 | Discharge: 2017-06-23 | Disposition: A | Discharge status: Home / Self Care | Payer: 59 | Source: Ambulatory Visit | Attending: Gastroenterology | Admitting: Gastroenterology

## 2017-06-23 DIAGNOSIS — R197 Diarrhea, unspecified: Secondary | ICD-10-CM | POA: Diagnosis not present

## 2017-06-23 LAB — C DIFFICILE QUICK SCREEN W PCR REFLEX
C DIFFICILE (CDIFF) TOXIN: NEGATIVE
C DIFFICLE (CDIFF) ANTIGEN: NEGATIVE
C Diff interpretation: NOT DETECTED

## 2017-07-22 ENCOUNTER — Other Ambulatory Visit: Payer: Self-pay | Admitting: Obstetrics and Gynecology

## 2018-01-25 ENCOUNTER — Encounter: Payer: Managed Care, Other (non HMO) | Admitting: Obstetrics and Gynecology

## 2018-02-08 ENCOUNTER — Ambulatory Visit (INDEPENDENT_AMBULATORY_CARE_PROVIDER_SITE_OTHER): Payer: 59 | Admitting: Obstetrics and Gynecology

## 2018-02-08 ENCOUNTER — Encounter: Payer: Self-pay | Admitting: Obstetrics and Gynecology

## 2018-02-08 VITALS — BP 110/73 | HR 94 | Ht 67.0 in | Wt 233.1 lb

## 2018-02-08 DIAGNOSIS — Z01419 Encounter for gynecological examination (general) (routine) without abnormal findings: Secondary | ICD-10-CM

## 2018-02-08 MED ORDER — CLOBETASOL PROPIONATE 0.05 % EX CREA: 1.0000 "application " | TOPICAL_CREAM | Freq: Two times a day (BID) | CUTANEOUS | 3 refills | Status: DC

## 2018-02-08 NOTE — Progress Notes (Signed)
Subjective:   Julia Nunez is a 47 y.o. G28P2012 Caucasian female here for a routine well-woman exam.  Patient's last menstrual period was 01/29/2018.    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  Past Medical History Past Medical History:  Diagnosis Date  . Anxiety   . Back pain   . Complication of anesthesia   . Family history of adverse reaction to anesthesia    DAD-NAUSEATED   . GERD (gastroesophageal reflux disease)   . Headache    H/O MIGRAINES  . Hypertension   . PONV (postoperative nausea and vomiting)    NAUSEATED  . Sleep apnea    CPAP    Past Surgical History Past Surgical History:  Procedure Laterality Date  . CESAREAN SECTION  1999   X1  . COLONOSCOPY WITH PROPOFOL N/A 03/27/2015   Procedure: COLONOSCOPY WITH PROPOFOL;  Surgeon: Manya Silvas, MD;  Location: [facility] ENDOSCOPY;  Service: Endoscopy;  Laterality: N/A;  . CYSTECTOMY     SEBACEOUS CYST ON SCALP  . CYSTOSCOPY    . ESOPHAGOGASTRODUODENOSCOPY N/A 03/27/2015   Procedure: ESOPHAGOGASTRODUODENOSCOPY (EGD);  Surgeon: Manya Silvas, MD;  Location: [facility] ENDOSCOPY;  Service: Endoscopy;  Laterality: N/A;  . LAPAROSCOPIC BILATERAL SALPINGECTOMY Bilateral 05/24/2016   Procedure: LAPAROSCOPIC BILATERAL TUBAL BANDING VIA FALOPE RINGS;  Surgeon: Brayton Mars, MD;  Location: [facility] ORS;  Service: Gynecology;  Laterality: Bilateral;  . SAVORY DILATION N/A 03/27/2015   Procedure: SAVORY DILATION;  Surgeon: Manya Silvas, MD;  Location: [facility] ENDOSCOPY;   Service: Endoscopy;  Laterality: N/A;    Gynecologic History [id number]  Patient's last menstrual period was 01/29/2018. Contraception: tubal ligation Last Pap: 2017. Results were: normal Last mammogram: 01/2017. Results were: normal  Obstetric History OB History  Gravida Para Term Preterm AB Living  3 2 2   1 2   SAB TAB Ectopic Multiple Live Births  1       2    # Outcome Date GA Lbr Len/2nd Weight Sex Delivery Anes PTL Lv  3 Term 1999   8 lb 6.4 oz (3.81 kg) F CS-LTranv   LIV  2 SAB 1994          1 Term 1992   9 lb 2.1 oz (4.141 kg) F Vag-Spont   LIV    Current Medications Current Outpatient Medications on File Prior to Visit  Medication Sig Dispense Refill  . ALPRAZolam (XANAX) 0.5 MG tablet Take 1 tablet (0.5 mg total) by mouth 3 (three) times daily as needed for anxiety. 45 tablet 2  . buPROPion (WELLBUTRIN XL) 150 MG 24 hr tablet Take 2 tablets (300 mg total) by mouth daily. 180 tablet 4  . clobetasol cream (TEMOVATE) 7.12 % Apply 1 application topically 2 (two) times daily. 30 g 3  . esomeprazole (NEXIUM) 40 MG capsule Take 40 mg by mouth daily before breakfast.     . hydrochlorothiazide (HYDRODIURIL) 25 MG tablet Take 1 tablet (25 mg total) by mouth daily. 90 tablet 4  . ibuprofen (ADVIL,MOTRIN) 800 MG tablet Take 1 tablet (800 mg total) by mouth 3 (three) times daily.  50 tablet 1  . vitamin B-12 (CYANOCOBALAMIN) 1000 MCG tablet Take 1,000 mcg by mouth daily.    . Vitamin D, Ergocalciferol, (DRISDOL) 50000 units CAPS capsule Take 1 capsule (50,000 Units total) by mouth every 7 (seven) days. 12 capsule 4  . zolpidem (AMBIEN) 10 MG tablet TAKE 1 TABLET AT BEDTIME AS NEEDED FOR SLEEP 30 tablet 2   No current facility-administered medications on file prior to visit.     Review of Systems Patient denies any headaches, blurred vision, shortness of breath, chest pain, abdominal pain, problems with bowel movements, urination, or intercourse.  Objective:  BP 110/73   Pulse 94    Ht 5\' 7"  (1.702 m)   Wt 233 lb 1.6 oz (105.7 kg)   LMP 01/29/2018   BMI 36.51 kg/m  Physical Exam  General:  Well developed, well nourished, no acute distress. She is alert and oriented x3. Skin:  Warm and dry Neck:  Midline trachea, no thyromegaly or nodules Cardiovascular: Regular rate and rhythm, no murmur heard Lungs:  Effort normal, all lung fields clear to auscultation bilaterally Breasts:  No dominant palpable mass, retraction, or nipple discharge Abdomen:  Soft, non tender, no hepatosplenomegaly or masses Pelvic:  External genitalia is normal in appearance.  The vagina is normal in appearance. The cervix is bulbous, no CMT.  Thin prep pap is not done . Uterus is felt to be normal size, shape, and contour.  No adnexal masses or tenderness noted. Extremities:  No swelling or varicosities noted Psych:  She has a normal mood and affect  Assessment:   Healthy well-woman exam  Plan:  clobex and xanax refilled. F/U 1 year for AE, or sooner if needed Mammogram ordered  Mellanie Bejarano Rockney Ghee, CNM

## 2018-02-09 ENCOUNTER — Other Ambulatory Visit: Payer: Self-pay | Admitting: Obstetrics and Gynecology

## 2018-02-09 DIAGNOSIS — Z1231 Encounter for screening mammogram for malignant neoplasm of breast: Secondary | ICD-10-CM

## 2018-02-14 ENCOUNTER — Encounter: Payer: Self-pay | Admitting: Obstetrics and Gynecology

## 2018-02-14 ENCOUNTER — Other Ambulatory Visit: Payer: Self-pay | Admitting: Obstetrics and Gynecology

## 2018-02-14 MED ORDER — CLINDAMYCIN PHOSPHATE 2 % VA CREA: 1.0000 | TOPICAL_CREAM | Freq: Every day | VAGINAL | 0 refills | Status: DC

## 2018-02-14 MED ORDER — FLUCONAZOLE 150 MG PO TABS: 150.0000 mg | ORAL_TABLET | Freq: Once | ORAL | 3 refills | Status: AC

## 2018-02-27 ENCOUNTER — Ambulatory Visit
Admission: RE | Admit: 2018-02-27 | Discharge: 2018-02-27 | Disposition: A | Discharge status: Home / Self Care | Payer: 59 | Source: Ambulatory Visit | Attending: Obstetrics and Gynecology | Admitting: Obstetrics and Gynecology

## 2018-02-27 DIAGNOSIS — Z1231 Encounter for screening mammogram for malignant neoplasm of breast: Secondary | ICD-10-CM | POA: Diagnosis present

## 2018-05-16 ENCOUNTER — Encounter: Payer: Self-pay | Admitting: *Deleted

## 2019-01-24 ENCOUNTER — Other Ambulatory Visit: Payer: Self-pay | Admitting: Obstetrics and Gynecology

## 2019-01-24 DIAGNOSIS — Z1231 Encounter for screening mammogram for malignant neoplasm of breast: Secondary | ICD-10-CM

## 2019-02-20 ENCOUNTER — Encounter: Payer: 59 | Admitting: Obstetrics and Gynecology

## 2019-03-07 ENCOUNTER — Other Ambulatory Visit: Payer: Self-pay

## 2019-03-07 ENCOUNTER — Encounter (INDEPENDENT_AMBULATORY_CARE_PROVIDER_SITE_OTHER): Payer: Self-pay

## 2019-03-07 ENCOUNTER — Ambulatory Visit
Admission: RE | Admit: 2019-03-07 | Discharge: 2019-03-07 | Disposition: A | Discharge status: Home / Self Care | Payer: Managed Care, Other (non HMO) | Source: Ambulatory Visit | Attending: Obstetrics and Gynecology | Admitting: Obstetrics and Gynecology

## 2019-03-07 DIAGNOSIS — Z1231 Encounter for screening mammogram for malignant neoplasm of breast: Secondary | ICD-10-CM | POA: Insufficient documentation

## 2019-08-25 ENCOUNTER — Other Ambulatory Visit: Payer: Self-pay | Admitting: Orthopedic Surgery

## 2019-08-25 DIAGNOSIS — S46001D Unspecified injury of muscle(s) and tendon(s) of the rotator cuff of right shoulder, subsequent encounter: Secondary | ICD-10-CM

## 2019-09-12 ENCOUNTER — Other Ambulatory Visit: Payer: Self-pay | Admitting: Orthopedic Surgery

## 2019-09-12 NOTE — Addendum Note (Signed)
Addended byLeim Fabry on: 09/12/2019 01:21 PM   Modules accepted: Orders

## 2019-09-13 ENCOUNTER — Encounter: Payer: Self-pay | Admitting: Orthopedic Surgery

## 2019-09-14 NOTE — Anesthesia Preprocedure Evaluation (Deleted)
Anesthesia Evaluation    History of Anesthesia Complications (+) PONV and history of anesthetic complications  Airway        Dental   Pulmonary sleep apnea , former smoker,           Cardiovascular hypertension,      Neuro/Psych  Headaches, PSYCHIATRIC DISORDERS Anxiety    GI/Hepatic GERD  ,  Endo/Other  diabetes  Renal/GU      Musculoskeletal   Abdominal (+) + obese (BMI 36),   Peds  Hematology   Anesthesia Other Findings   Reproductive/Obstetrics                             Anesthesia Physical Anesthesia Plan Anesthesia Quick Evaluation

## 2019-09-19 ENCOUNTER — Other Ambulatory Visit
Admission: RE | Admit: 2019-09-19 | Discharge: 2019-09-19 | Disposition: A | Discharge status: Home / Self Care | Payer: Managed Care, Other (non HMO) | Source: Ambulatory Visit | Attending: Orthopedic Surgery | Admitting: Orthopedic Surgery

## 2019-09-19 ENCOUNTER — Other Ambulatory Visit: Payer: Self-pay

## 2019-09-19 DIAGNOSIS — Z20822 Contact with and (suspected) exposure to covid-19: Secondary | ICD-10-CM | POA: Diagnosis not present

## 2019-09-19 DIAGNOSIS — Z01812 Encounter for preprocedural laboratory examination: Secondary | ICD-10-CM | POA: Diagnosis present

## 2019-09-20 LAB — SARS CORONAVIRUS 2 (TAT 6-24 HRS): SARS Coronavirus 2: NEGATIVE

## 2019-09-21 ENCOUNTER — Encounter: Payer: Self-pay | Admitting: Orthopedic Surgery

## 2019-09-21 ENCOUNTER — Other Ambulatory Visit: Payer: Self-pay

## 2019-09-21 ENCOUNTER — Ambulatory Visit: Payer: Managed Care, Other (non HMO) | Admitting: Anesthesiology

## 2019-09-21 ENCOUNTER — Encounter
Admission: RE | Disposition: A | Discharge status: Home / Self Care | Payer: Self-pay | Source: Home / Self Care | Attending: Orthopedic Surgery

## 2019-09-21 ENCOUNTER — Ambulatory Visit
Admission: RE | Admit: 2019-09-21 | Discharge: 2019-09-21 | Disposition: A | Discharge status: Home / Self Care | Payer: Managed Care, Other (non HMO) | Attending: Orthopedic Surgery | Admitting: Orthopedic Surgery

## 2019-09-21 DIAGNOSIS — E559 Vitamin D deficiency, unspecified: Secondary | ICD-10-CM | POA: Insufficient documentation

## 2019-09-21 DIAGNOSIS — G4736 Sleep related hypoventilation in conditions classified elsewhere: Secondary | ICD-10-CM | POA: Diagnosis not present

## 2019-09-21 DIAGNOSIS — Z7984 Long term (current) use of oral hypoglycemic drugs: Secondary | ICD-10-CM | POA: Insufficient documentation

## 2019-09-21 DIAGNOSIS — F419 Anxiety disorder, unspecified: Secondary | ICD-10-CM | POA: Insufficient documentation

## 2019-09-21 DIAGNOSIS — E669 Obesity, unspecified: Secondary | ICD-10-CM | POA: Diagnosis not present

## 2019-09-21 DIAGNOSIS — Z6837 Body mass index (BMI) 37.0-37.9, adult: Secondary | ICD-10-CM | POA: Diagnosis not present

## 2019-09-21 DIAGNOSIS — M25811 Other specified joint disorders, right shoulder: Secondary | ICD-10-CM | POA: Diagnosis not present

## 2019-09-21 DIAGNOSIS — Z87891 Personal history of nicotine dependence: Secondary | ICD-10-CM | POA: Insufficient documentation

## 2019-09-21 DIAGNOSIS — E119 Type 2 diabetes mellitus without complications: Secondary | ICD-10-CM | POA: Insufficient documentation

## 2019-09-21 DIAGNOSIS — K219 Gastro-esophageal reflux disease without esophagitis: Secondary | ICD-10-CM | POA: Insufficient documentation

## 2019-09-21 DIAGNOSIS — M19011 Primary osteoarthritis, right shoulder: Secondary | ICD-10-CM | POA: Insufficient documentation

## 2019-09-21 DIAGNOSIS — I10 Essential (primary) hypertension: Secondary | ICD-10-CM | POA: Insufficient documentation

## 2019-09-21 DIAGNOSIS — Z79899 Other long term (current) drug therapy: Secondary | ICD-10-CM | POA: Insufficient documentation

## 2019-09-21 HISTORY — DX: Presence of spectacles and contact lenses: Z97.3

## 2019-09-21 HISTORY — DX: Type 2 diabetes mellitus without complications: E11.9

## 2019-09-21 LAB — GLUCOSE, CAPILLARY
Glucose-Capillary: 183 mg/dL — ABNORMAL HIGH (ref 70–99)
Glucose-Capillary: 150 mg/dL — ABNORMAL HIGH (ref 70–99)

## 2019-09-21 LAB — POCT PREGNANCY, URINE: Preg Test, Ur: NEGATIVE

## 2019-09-21 SURGERY — SHOULDER ARTHROSCOPY WITH SUBACROMIAL DECOMPRESSION AND DISTAL CLAVICLE EXCISION
Anesthesia: Regional | Site: Shoulder | Laterality: Right

## 2019-09-21 MED ORDER — LACTATED RINGERS IV SOLN: INTRAVENOUS | Status: DC

## 2019-09-21 MED ORDER — OXYCODONE HCL 5 MG PO TABS: 5.0000 mg | ORAL_TABLET | Freq: Once | ORAL | Status: DC | PRN

## 2019-09-21 MED ORDER — ACETAMINOPHEN 160 MG/5ML PO SOLN: 325.0000 mg | ORAL | Status: DC | PRN

## 2019-09-21 MED ORDER — IBUPROFEN 800 MG PO TABS: 800.0000 mg | ORAL_TABLET | Freq: Three times a day (TID) | ORAL | 1 refills | Status: AC

## 2019-09-21 MED ORDER — PHENYLEPHRINE HCL (PRESSORS) 10 MG/ML IV SOLN
INTRAVENOUS | Status: DC | PRN
  Administered 2019-09-21 (×2): 100 ug via INTRAVENOUS

## 2019-09-21 MED ORDER — LIDOCAINE HCL (CARDIAC) PF 100 MG/5ML IV SOSY
PREFILLED_SYRINGE | INTRAVENOUS | Status: DC | PRN
  Administered 2019-09-21: 30 mg via INTRATRACHEAL

## 2019-09-21 MED ORDER — BUPIVACAINE HCL (PF) 0.5 % IJ SOLN
INTRAMUSCULAR | Status: DC | PRN
  Administered 2019-09-21: 20 mL via PERINEURAL

## 2019-09-21 MED ORDER — ASPIRIN EC 325 MG PO TBEC: 325.0000 mg | DELAYED_RELEASE_TABLET | Freq: Every day | ORAL | 0 refills | Status: AC

## 2019-09-21 MED ORDER — LACTATED RINGERS IV SOLN
INTRAVENOUS | Status: DC | PRN
  Administered 2019-09-21: 4 mL

## 2019-09-21 MED ORDER — DEXAMETHASONE SODIUM PHOSPHATE 4 MG/ML IJ SOLN
INTRAMUSCULAR | Status: DC | PRN
  Administered 2019-09-21: 4 mg via INTRAVENOUS

## 2019-09-21 MED ORDER — BUPIVACAINE LIPOSOME 1.3 % IJ SUSP
INTRAMUSCULAR | Status: DC | PRN
  Administered 2019-09-21: 20 mL via PERINEURAL

## 2019-09-21 MED ORDER — FENTANYL CITRATE (PF) 100 MCG/2ML IJ SOLN: 25.0000 ug | INTRAMUSCULAR | Status: DC | PRN

## 2019-09-21 MED ORDER — PROPOFOL 10 MG/ML IV BOLUS
INTRAVENOUS | Status: DC | PRN
  Administered 2019-09-21: 170 mg via INTRAVENOUS

## 2019-09-21 MED ORDER — SCOPOLAMINE 1 MG/3DAYS TD PT72
1.0000 | MEDICATED_PATCH | TRANSDERMAL | Status: DC
  Administered 2019-09-21: 07:00:00 1.5 mg via TRANSDERMAL

## 2019-09-21 MED ORDER — OXYCODONE HCL 5 MG PO TABS: 5.0000 mg | ORAL_TABLET | ORAL | 0 refills | Status: DC | PRN

## 2019-09-21 MED ORDER — CHLORHEXIDINE GLUCONATE 4 % EX LIQD: 60.0000 mL | Freq: Once | CUTANEOUS | Status: DC

## 2019-09-21 MED ORDER — MIDAZOLAM HCL 5 MG/5ML IJ SOLN
INTRAMUSCULAR | Status: DC | PRN
  Administered 2019-09-21: 2 mg via INTRAVENOUS

## 2019-09-21 MED ORDER — OXYCODONE HCL 5 MG/5ML PO SOLN: 5.0000 mg | Freq: Once | ORAL | Status: DC | PRN

## 2019-09-21 MED ORDER — FENTANYL CITRATE (PF) 100 MCG/2ML IJ SOLN
INTRAMUSCULAR | Status: DC | PRN
  Administered 2019-09-21: 100 ug via INTRAVENOUS

## 2019-09-21 MED ORDER — CEFAZOLIN SODIUM-DEXTROSE 2-4 GM/100ML-% IV SOLN
2.0000 g | INTRAVENOUS | Status: AC
  Administered 2019-09-21: 2 g via INTRAVENOUS

## 2019-09-21 MED ORDER — ACETAMINOPHEN 325 MG PO TABS: 325.0000 mg | ORAL_TABLET | ORAL | Status: DC | PRN

## 2019-09-21 MED ORDER — ACETAMINOPHEN 500 MG PO TABS: 1000.0000 mg | ORAL_TABLET | Freq: Three times a day (TID) | ORAL | 2 refills | Status: DC

## 2019-09-21 MED ORDER — ONDANSETRON HCL 4 MG/2ML IJ SOLN
INTRAMUSCULAR | Status: DC | PRN
  Administered 2019-09-21: 4 mg via INTRAVENOUS

## 2019-09-21 SURGICAL SUPPLY — 52 items
ADAPTER IRRIG TUBE 2 SPIKE SOL (ADAPTER) ×6 IMPLANT
BUR BR 5.5 12 FLUTE (BURR) ×3 IMPLANT
BUR RADIUS 4.0X18.5 (BURR) ×3 IMPLANT
CANNULA 5.75X7CM (CANNULA) ×1
CANNULA PART THRD DISP 5.75X7 (CANNULA) ×2 IMPLANT
CHLORAPREP W/TINT 26 (MISCELLANEOUS) ×3 IMPLANT
COOLER POLAR GLACIER W/PUMP (MISCELLANEOUS) ×3 IMPLANT
COVER LIGHT HANDLE UNIVERSAL (MISCELLANEOUS) ×6 IMPLANT
DERMABOND ADVANCED (GAUZE/BANDAGES/DRESSINGS) ×2
DERMABOND ADVANCED .7 DNX12 (GAUZE/BANDAGES/DRESSINGS) ×1 IMPLANT
DRAPE IMP U-DRAPE 54X76 (DRAPES) ×6 IMPLANT
DRAPE INCISE IOBAN 66X45 STRL (DRAPES) ×3 IMPLANT
DRAPE SHEET LG 3/4 BI-LAMINATE (DRAPES) ×3 IMPLANT
DRAPE U-SHAPE 48X52 POLY STRL (PACKS) ×6 IMPLANT
DRSG TEGADERM 4X4.75 (GAUZE/BANDAGES/DRESSINGS) ×9 IMPLANT
ELECT REM PT RETURN 9FT ADLT (ELECTROSURGICAL) ×3
ELECTRODE REM PT RTRN 9FT ADLT (ELECTROSURGICAL) ×1 IMPLANT
GAUZE XEROFORM 1X8 LF (GAUZE/BANDAGES/DRESSINGS) ×3 IMPLANT
GLOVE BIO SURGEON STRL SZ7.5 (GLOVE) ×6 IMPLANT
GLOVE BIOGEL PI IND STRL 8 (GLOVE) ×1 IMPLANT
GLOVE BIOGEL PI INDICATOR 8 (GLOVE) ×2
GOWN STRL REIN 2XL XLG LVL4 (GOWN DISPOSABLE) ×3 IMPLANT
GOWN STRL REUS W/ TWL LRG LVL3 (GOWN DISPOSABLE) ×1 IMPLANT
GOWN STRL REUS W/TWL LRG LVL3 (GOWN DISPOSABLE) ×2
IV LACTATED RINGER IRRG 3000ML (IV SOLUTION) ×16
IV LR IRRIG 3000ML ARTHROMATIC (IV SOLUTION) ×8 IMPLANT
KIT STABILIZATION SHOULDER (MISCELLANEOUS) ×3 IMPLANT
KIT TURNOVER KIT A (KITS) ×3 IMPLANT
MANIFOLD NEPTUNE II (INSTRUMENTS) ×3 IMPLANT
MASK FACE SPIDER DISP (MASK) ×3 IMPLANT
MAT GRAY ABSORB FLUID 28X50 (MISCELLANEOUS) ×6 IMPLANT
NDL SAFETY ECLIPSE 18X1.5 (NEEDLE) ×1 IMPLANT
NEEDLE HYPO 18GX1.5 SHARP (NEEDLE) ×2
PACK ARTHROSCOPY SHOULDER (MISCELLANEOUS) ×3 IMPLANT
PAD WRAPON POLAR SHDR UNIV (MISCELLANEOUS) ×1 IMPLANT
PENCIL SMOKE EVACUATOR (MISCELLANEOUS) IMPLANT
SET TUBE SUCT SHAVER OUTFL 24K (TUBING) ×3 IMPLANT
SET TUBE TIP INTRA-ARTICULAR (MISCELLANEOUS) ×3 IMPLANT
SPONGE GAUZE 2X2 8PLY STER LF (GAUZE/BANDAGES/DRESSINGS) ×3
SPONGE GAUZE 2X2 8PLY STRL LF (GAUZE/BANDAGES/DRESSINGS) ×6 IMPLANT
SUT ETHILON 3-0 FS-10 30 BLK (SUTURE) ×3
SUT MNCRL 4-0 (SUTURE) ×2
SUT MNCRL 4-0 27XMFL (SUTURE) ×1
SUT VIC AB 0 CT1 36 (SUTURE) ×3 IMPLANT
SUT VIC AB 2-0 CT2 27 (SUTURE) ×3 IMPLANT
SUTURE EHLN 3-0 FS-10 30 BLK (SUTURE) ×1 IMPLANT
SUTURE MNCRL 4-0 27XMF (SUTURE) ×1 IMPLANT
SYR 10ML LL (SYRINGE) ×3 IMPLANT
TAPE MICROFOAM 4IN (TAPE) ×3 IMPLANT
TUBING ARTHRO INFLOW-ONLY STRL (TUBING) ×3 IMPLANT
WAND WEREWOLF FLOW 90D (MISCELLANEOUS) ×3 IMPLANT
WRAPON POLAR PAD SHDR UNIV (MISCELLANEOUS) ×3

## 2019-09-21 NOTE — Op Note (Addendum)
SURGERY DATE: 09/21/2019  PRE-OP DIAGNOSIS:  1. Right acromioclavicular joint osteoarthritis 2. Right subacromial impingement 3. Right biceps tendinopathy  POST-OP DIAGNOSIS: 1. Right acromioclavicular joint osteoarthritis 2. Right subacromial impingement  PROCEDURES:  1. Right arthroscopic distal clavicle excision 2. Right extensive debridement of shoulder (glenohumeral and subacromial spaces) 3. Right subacromial decompression  SURGEON: Cato Mulligan, MD  ASSISTANT: Anitra Lauth, PA  ANESTHESIA: Gen with Exparel interscalene block  ESTIMATED BLOOD LOSS: 5cc  DRAINS:  none  TOTAL IV FLUIDS: per anesthesia   SPECIMENS: none  IMPLANTS: none  OPERATIVE FINDINGS:  Examination under anesthesia: A careful examination under anesthesia was performed.  Passive range of motion was: FF: 160; ER at side: 70; ER in abduction: 95; IR in abduction: 50.  Anterior load shift: NT.  Posterior load shift: NT.  Sulcus in neutral: NT.  Sulcus in ER: NT.    Intra-operative findings: A thorough arthroscopic examination of the shoulder was performed.  The findings are: 1. Biceps tendon: intact without erythema, thickening, subluxation, or thickening 2. Superior labrum: Mild degenerative fraying; no SLAP tear 3. Posterior labrum and capsule: normal 4. Inferior capsule and inferior recess: normal 5. Glenoid cartilage surface: normal 6. Supraspinatus attachment: normal 7. Posterior rotator cuff attachment: normal 8. Humeral head articular cartilage: normal 9. Rotator interval: mild synovitis 10: Subscapularis tendon: attachment intact 11. Anterior labrum: mildly degenerative tissue 12. IGHL: normal  OPERATIVE REPORT:   Indications for procedure: Julia Nunez is a 49 y.o. female with approximately 3 months of right shoulder pain after falling off a ladder while hanging lights.  Clinical exam and MRI were concerning for primarily [facility] joint pain and bone marrow edema with some concern for  biceps pathology and subacromial impingement.  She had failed nonoperative management options.  After discussion of risks, benefits, and alternatives to surgery, the patient elected to proceed.     Procedure in detail:  I identified the patient in the pre-operative holding area.  I marked the operative shoulder with my initials. I reviewed the risks and benefits of the proposed surgical intervention, and the patient (and/or patient's guardian) wished to proceed.  Anesthesia was then performed with an interscalene block with Exparel.  The patient was transferred to the operative suite and placed in the beach chair position.    SCDs were placed on the lower extremities. Appropriate IV antibiotics were administered prior to incision. The operative upper extremity was then prepped and draped in standard fashion. A time out was performed confirming the correct extremity, correct patient, and correct procedure.   I then created a standard posterior portal with an 11 blade. The glenohumeral joint was easily entered with a blunt trochar and the arthroscope introduced. The findings of diagnostic arthroscopy are described above. I debrided degenerative tissue including the synovitic tissue about the rotator interval and superior labrum. I then coagulated the inflamed synovium to obtain hemostasis and reduce the risk of post-operative swelling using an Arthrocare radiofrequency device.  Next, the arthroscope was then introduced into the subacromial space. A direct lateral portal was created with an 11-blade after spinal needle localization. An extensive subacromial bursectomy was performed using a combination of the shaver and Arthrocare wand. The entire acromial undersurface was exposed and the CA ligament was subperiosteally elevated to expose the anterior acromial hook. A 5.12mm barrel burr was used to create a flat anterior and lateral aspect of the acromion, converting it from a Type 2 to a Type 1 acromion. Care  was made to keep the  deltoid fascia intact.  I then turned my attention to the arthroscopic distal clavicle excision. I identified the acromioclavicular joint. Surrounding bursal tissue was debrided and the edges of the joint were identified. I used the 5.14mm barrel burr to remove the distal clavicle parallel to the edge of the acromion. I was able to fit two widths of the burr into the space between the distal clavicle and acromion, signifying that I had removed ~32mm of distal clavicle.  This was confirmed using a probe to measure the space between the distal clavicle and acromion.  Hemostasis was achieved with an Arthrocare wand.   Fluid was evacuated from the shoulder, and the portals were closed with 3-0 Nylon. Xeroform was applied to the portals. A sterile dressing was applied, followed by a Polar Care sleeve and a SlingShot shoulder immobilizer/sling. The patient was awakened from anesthesia without difficulty and was transferred to the PACU in stable condition.    Of note, assistance from a PA was essential to performing the surgery.  PA was present for the entire surgery.  PA assisted with patient positioning, instrumentation, and wound closure. The surgery would have been more difficult and had longer operative time without PA assistance.    COMPLICATIONS: none  DISPOSITION: plan for discharge home after recovery in PACU   POSTOPERATIVE PLAN: Wean from sling as able over the next 1-2 weeks. PT to begin 3-4 days after surgery.  Use subacromial decompression/distal clavicle excision rehab protocol.  ASA 325 mg daily x2 weeks for DVT prophylaxis.  Follow-up with me as an outpatient in approximately 2 weeks.

## 2019-09-21 NOTE — Anesthesia Procedure Notes (Signed)
Procedure Name: LMA Insertion Date/Time: 09/21/2019 7:42 AM Performed by: Cameron Ali, CRNA Pre-anesthesia Checklist: Patient identified, Emergency Drugs available, Suction available, Timeout performed and Patient being monitored Patient Re-evaluated:Patient Re-evaluated prior to induction Oxygen Delivery Method: Circle system utilized Preoxygenation: Pre-oxygenation with 100% oxygen Induction Type: IV induction LMA: LMA inserted LMA Size: 4.0 Number of attempts: 1 Placement Confirmation: positive ETCO2 and breath sounds checked- equal and bilateral Tube secured with: Tape Dental Injury: Teeth and Oropharynx as per pre-operative assessment

## 2019-09-21 NOTE — Progress Notes (Signed)
Assisted Elsje Harker, ANMD  with right, ultrasound guided, interscalene  block. Side rails up, monitors on throughout procedure. See vital signs in flow sheet. Tolerated Procedure well.  

## 2019-09-21 NOTE — H&P (Signed)
Paper H&P to be scanned into permanent record. H&P reviewed. No significant changes noted.  

## 2019-09-21 NOTE — Anesthesia Procedure Notes (Signed)
Anesthesia Regional Block: Interscalene brachial plexus block   Pre-Anesthetic Checklist: ,, timeout performed, Correct Patient, Correct Site, Correct Laterality, Correct Procedure, Correct Position, site marked, Risks and benefits discussed,  Surgical consent,  Pre-op evaluation,  At surgeon's request and post-op pain management  Laterality: Right  Prep: chloraprep       Needles:  Injection technique: Single-shot  Needle Type: Stimiplex     Needle Length: 10cm  Needle Gauge: 21     Additional Needles:   Procedures:,,,, ultrasound used (permanent image in chart),,,,  Narrative:  Start time: 09/21/2019 7:05 AM End time: 09/21/2019 7:10 AM Injection made incrementally with aspirations every 5 mL.  Performed by: Personally  Anesthesiologist: Veda Canning, MD

## 2019-09-21 NOTE — Anesthesia Postprocedure Evaluation (Signed)
Anesthesia Post Note  Patient: Julia Nunez  Procedure(s) Performed: RIGHT SHOULDER ARTHROSCOPIC SUBACROMIAL DECOMPRESSION, DISTAL CLAVICLE DEBRIDMENT (Right Shoulder)     Patient location during evaluation: PACU Anesthesia Type: Regional Level of consciousness: awake Pain management: pain level controlled Vital Signs Assessment: post-procedure vital signs reviewed and stable Respiratory status: respiratory function stable Cardiovascular status: stable Postop Assessment: no signs of nausea or vomiting Anesthetic complications: no    Veda Canning

## 2019-09-21 NOTE — Anesthesia Preprocedure Evaluation (Signed)
Anesthesia Evaluation  Patient identified by MRN, date of birth, ID band Patient awake    Reviewed: Allergy & Precautions, NPO status , Patient's Chart, lab work & pertinent test results  History of Anesthesia Complications (+) PONV  Airway Mallampati: II  TM Distance: >3 FB     Dental   Pulmonary former smoker,    breath sounds clear to auscultation       Cardiovascular hypertension,  Rhythm:Regular Rate:Normal     Neuro/Psych  Headaches, Anxiety    GI/Hepatic GERD  ,  Endo/Other  diabetesObesity   Renal/GU      Musculoskeletal   Abdominal   Peds  Hematology   Anesthesia Other Findings   Reproductive/Obstetrics                             Anesthesia Physical Anesthesia Plan  ASA: II  Anesthesia Plan: General and Regional   Post-op Pain Management:    Induction: Intravenous  PONV Risk Score and Plan: 4 or greater and Midazolam, Ondansetron and Dexamethasone  Airway Management Planned: LMA  Additional Equipment:   Intra-op Plan:   Post-operative Plan:   Informed Consent: I have reviewed the patients History and Physical, chart, labs and discussed the procedure including the risks, benefits and alternatives for the proposed anesthesia with the patient or authorized representative who has indicated his/her understanding and acceptance.       Plan Discussed with: CRNA  Anesthesia Plan Comments:         Anesthesia Quick Evaluation

## 2019-09-21 NOTE — Transfer of Care (Signed)
Immediate Anesthesia Transfer of Care Note  Patient: Julia Nunez  Procedure(s) Performed: RIGHT SHOULDER ARTHROSCOPIC SUBACROMIAL DECOMPRESSION, DISTAL CLAVICLE DEBRIDMENT (Right Shoulder)  Patient Location: PACU  Anesthesia Type: General, Regional  Level of Consciousness: awake, alert  and patient cooperative  Airway and Oxygen Therapy: Patient Spontanous Breathing and Patient connected to supplemental oxygen  Post-op Assessment: Post-op Vital signs reviewed, Patient's Cardiovascular Status Stable, Respiratory Function Stable, Patent Airway and No signs of Nausea or vomiting  Post-op Vital Signs: Reviewed and stable  Complications: No apparent anesthesia complications

## 2019-09-21 NOTE — Discharge Instructions (Signed)
Post-Op Instructions - Arthroscopic Subacromial Decompression and/or Distal Clavicle Excision  1. Bracing: You will wear a shoulder immobilizer or sling for no longer than 2 weeks. You can self wean from sling when it is comfortable to do so. Most patients usually wean around 1 week after surgery.  2. Driving: No driving until out of sling  3. Activity: Progress to motion as tolerated, moving from passive to active-assisted to active motion. Goal for motion by 4 weeks is 140 degrees forward flexion, 40 ER, IR behind back. Avoid abduction and ER/IR until after 4 weeks.  Return to daily activities normally takes 2-3 months on average.  4. Physical Therapy: Begins 3-4 days after surgery  5. Medications:  - You will be provided a prescription for narcotic pain medicine. After surgery, take 1-2 narcotic tablets every 4 hours if needed for severe pain.  - A prescription for anti-nausea medication will be provided in case the narcotic medicine causes nausea - take 1 tablet every 6 hours only if nauseated.   - Take ASA 325mg /day x 2 weeks to help prevent DVTs/PEs (blood clots).  - Take ibuprofen 800mg  three times/day with food. Stop if there is any GI irritation.   If you are taking prescription medication for anxiety, depression, insomnia, muscle spasm, chronic pain, or for attention deficit disorder, you are advised that you are at a higher risk of adverse effects with use of narcotics post-op, including narcotic addiction/dependence, depressed breathing, death. If you use non-prescribed substances: alcohol, marijuana, cocaine, heroin, methamphetamines, etc., you are at a higher risk of adverse effects with use of narcotics post-op, including narcotic addiction/dependence, depressed breathing, death. You are advised that taking > 50 morphine milligram equivalents (MME) of narcotic pain medication per day results in twice the risk of overdose or death. For your prescription provided: hydrocodone 5 mg -  taking more than 8 tablets per day would result in > 50 morphine milligram equivalents (MME) of narcotic pain medication. Be advised that we will prescribe narcotics short-term, for acute post-operative pain only - 3 weeks for major operations such as shoulder repair/reconstruction surgeries.    6. Post-Op Appointment:  Your first post-op appointment will be 10-14 days post-op.  7. Work or School: For most, but not all procedures, we advise staying out of work or school for at least 1 to 2 weeks in order to recover from the stress of surgery and to allow time for healing.   If you need a work or school note this can be provided.   8. Smoking: If you are a smoker, you need to refrain from smoking in the postoperative period. The nicotine in cigarettes will inhibit healing of your shoulder repair and decrease the chance of successful repair. Similarly, nicotine containing products (gum, patches) should be avoided.   Post-operative Brace: Apply and remove the brace you received as you were instructed to at the time of fitting and as described in detail as the brace's instructions for use indicate.  Wear the brace for the period of time prescribed by your physician.  The brace can be cleaned with soap and water and allowed to air dry only.  Should the brace result in increased pain, decreased feeling (numbness/tingling), increased swelling or an overall worsening of your medical condition, please contact your doctor immediately.  If an emergency situation occurs as a result of wearing the brace after normal business hours, please dial 911 and seek immediate medical attention.  Let your doctor know if you have any further  questions about the brace issued to you. Refer to the shoulder sling instructions for use if you have any questions regarding the correct fit of your shoulder sling.  [place] for Troubleshooting: [phone]  Video that illustrates how to properly use a shoulder  sling: "Instructions for Proper Use of an Orthopaedic Sling" [email or web address]    Scopolamine skin patches REMOVE PATCH IN 72 HOURS AND [place] HANDS IMMEDIATELY  What is this medicine? SCOPOLAMINE (skoe POL a meen) is used to prevent nausea and vomiting caused by motion sickness, anesthesia and surgery. This medicine may be used for other purposes; ask your health care provider or pharmacist if you have questions. COMMON BRAND NAME(S): Transderm Scop What should I tell my health care provider before I take this medicine? They need to know if you have any of these conditions:  are scheduled to have a gastric secretion test  glaucoma  heart disease  kidney disease  liver disease  lung or breathing disease, like asthma  mental illness  prostate disease  seizures  stomach or intestine problems  trouble passing urine  an unusual or allergic reaction to scopolamine, atropine, other medicines, foods, dyes, or preservatives  pregnant or trying to get pregnant  breast-feeding How should I use this medicine? This medicine is for external use only. Follow the directions on the prescription label. Wear only 1 patch at a time. Choose an area behind the ear, that is clean, dry, hairless and free from any cuts or irritation. Wipe the area with a clean dry tissue. Peel off the plastic backing of the skin patch, trying not to touch the adhesive side with your hands. Do not cut the patches. Firmly apply to the area you have chosen, with the metallic side of the patch to the skin and the tan-colored side showing. Once firmly in place, wash your hands well with soap and water. Do not get this medicine into your eyes. After removing the patch, wash your hands and the area behind your ear thoroughly with soap and water. The patch will still contain some medicine after use. To avoid accidental contact or ingestion by children or pets, fold the used patch in half with the  sticky side together and throw away in the trash out of the reach of children and pets. If you need to use a second patch after you remove the first, place it behind the other ear. A special MedGuide will be given to you by the pharmacist with each prescription and refill. Be sure to read this information carefully each time. Talk to your pediatrician regarding the use of this medicine in children. Special care may be needed. Overdosage: If you think you have taken too much of this medicine contact a poison control center or emergency room at once. NOTE: This medicine is only for you. Do not share this medicine with others. What if I miss a dose? This does not apply. This medicine is not for regular use. What may interact with this medicine?  alcohol  antihistamines for allergy cough and cold  atropine  certain medicines for anxiety or sleep  certain medicines for bladder problems like oxybutynin, tolterodine  certain medicines for depression like amitriptyline, fluoxetine, sertraline  certain medicines for stomach problems like dicyclomine, hyoscyamine  certain medicines for Parkinson's disease like benztropine, trihexyphenidyl  certain medicines for seizures like phenobarbital, primidone  general anesthetics like halothane, isoflurane, methoxyflurane, propofol  ipratropium  local anesthetics like lidocaine, pramoxine, tetracaine  medicines that relax muscles for surgery  phenothiazines like chlorpromazine, mesoridazine, prochlorperazine, thioridazine  narcotic medicines for pain  other belladonna alkaloids This list may not describe all possible interactions. Give your health care provider a list of all the medicines, herbs, non-prescription drugs, or dietary supplements you use. Also tell them if you smoke, drink alcohol, or use illegal drugs. Some items may interact with your medicine. What should I watch for while using this medicine? Limit contact with water while  swimming and bathing because the patch may fall off. If the patch falls off, throw it away and put a new one behind the other ear. You may get drowsy or dizzy. Do not drive, use machinery, or do anything that needs mental alertness until you know how this medicine affects you. Do not stand or sit up quickly, especially if you are an older patient. This reduces the risk of dizzy or fainting spells. Alcohol may interfere with the effect of this medicine. Avoid alcoholic drinks. Your mouth may get dry. Chewing sugarless gum or sucking hard candy, and drinking plenty of water may help. Contact your healthcare professional if the problem does not go away or is severe. This medicine may cause dry eyes and blurred vision. If you wear contact lenses, you may feel some discomfort. Lubricating drops may help. See your healthcare professional if the problem does not go away or is severe. If you are going to need surgery, an MRI, CT scan, or other procedure, tell your healthcare professional that you are using this medicine. You may need to remove the patch before the procedure. What side effects may I notice from receiving this medicine? Side effects that you should report to your doctor or health care professional as soon as possible:  allergic reactions like skin rash, itching or hives; swelling of the face, lips, or tongue  blurred vision  changes in vision  confusion  dizziness  eye pain  fast, irregular heartbeat  hallucinations, loss of contact with reality  nausea, vomiting  pain or trouble passing urine  restlessness  seizures  skin irritation  stomach pain Side effects that usually do not require medical attention (report to your doctor or health care professional if they continue or are bothersome):  drowsiness  dry mouth  headache  sore throat This list may not describe all possible side effects. Call your doctor for medical advice about side effects. You may report side  effects to FDA at 1-800-FDA-1088. Where should I keep my medicine? Keep out of the reach of children. Store at room temperature between 20 and 25 degrees C (68 and 77 degrees F). Keep this medicine in the foil package until ready to use. Throw away any unused medicine after the expiration date. NOTE: This sheet is a summary. It may not cover all possible information. If you have questions about this medicine, talk to your doctor, pharmacist, or health care provider.  2020 Elsevier/Gold Standard (2017-09-30 16:14:46)   Information for Discharge Teaching: EXPAREL (bupivacaine liposome injectable suspension)   Your surgeon or anesthesiologist gave you EXPAREL(bupivacaine) to help control your pain after surgery.   EXPAREL is a local anesthetic that provides pain relief by numbing the tissue around the surgical site.  EXPAREL is designed to release pain medication over time and can control pain for up to 72 hours.  Depending on how you respond to EXPAREL, you may require less pain medication during your recovery.  Possible side effects:  Temporary loss of sensation or ability to move in the area where bupivacaine was injected.  Nausea, vomiting, constipation  Rarely, numbness and tingling in your mouth or lips, lightheadedness, or anxiety may occur.  Call your doctor right away if you think you may be experiencing any of these sensations, or if you have other questions regarding possible side effects.  Follow all other discharge instructions given to you by your surgeon or nurse. Eat a healthy diet and drink plenty of water or other fluids.  If you return to the hospital for any reason within 96 hours following the administration of EXPAREL, it is important for health care providers to know that you have received this anesthetic. A teal colored band has been placed on your arm with the date, time and amount of EXPAREL you have received in order to alert and inform your health care  providers. Please leave this armband in place for the full 96 hours following administration, and then you may remove the band.  General Anesthesia, Adult, Care After This sheet gives you information about how to care for yourself after your procedure. Your health care provider may also give you more specific instructions. If you have problems or questions, contact your health care provider. What can I expect after the procedure? After the procedure, the following side effects are common:  Pain or discomfort at the IV site.  Nausea.  Vomiting.  Sore throat.  Trouble concentrating.  Feeling cold or chills.  Weak or tired.  Sleepiness and fatigue.  Soreness and body aches. These side effects can affect parts of the body that were not involved in surgery. Follow these instructions at home:  For at least 24 hours after the procedure:  Have a responsible adult stay with you. It is important to have someone help care for you until you are awake and alert.  Rest as needed.  Do not: ? Participate in activities in which you could fall or become injured. ? Drive. ? Use heavy machinery. ? Drink alcohol. ? Take sleeping pills or medicines that cause drowsiness. ? Make important decisions or sign legal documents. ? Take care of children on your own. Eating and drinking  Follow any instructions from your health care provider about eating or drinking restrictions.  When you feel hungry, start by eating small amounts of foods that are soft and easy to digest (bland), such as toast. Gradually return to your regular diet.  Drink enough fluid to keep your urine pale yellow.  If you vomit, rehydrate by drinking water, juice, or clear broth. General instructions  If you have sleep apnea, surgery and certain medicines can increase your risk for breathing problems. Follow instructions from your health care provider about wearing your sleep device: ? Anytime you are sleeping, including  during daytime naps. ? While taking prescription pain medicines, sleeping medicines, or medicines that make you drowsy.  Return to your normal activities as told by your health care provider. Ask your health care provider what activities are safe for you.  Take over-the-counter and prescription medicines only as told by your health care provider.  If you smoke, do not smoke without supervision.  Keep all follow-up visits as told by your health care provider. This is important. Contact a health care provider if:  You have nausea or vomiting that does not get better with medicine.  You cannot eat or drink without vomiting.  You have pain that does not get better with medicine.  You are unable to pass urine.  You develop a skin rash.  You have a fever.  You have  redness around your IV site that gets worse. Get help right away if:  You have difficulty breathing.  You have chest pain.  You have blood in your urine or stool, or you vomit blood. Summary  After the procedure, it is common to have a sore throat or nausea. It is also common to feel tired.  Have a responsible adult stay with you for the first 24 hours after general anesthesia. It is important to have someone help care for you until you are awake and alert.  When you feel hungry, start by eating small amounts of foods that are soft and easy to digest (bland), such as toast. Gradually return to your regular diet.  Drink enough fluid to keep your urine pale yellow.  Return to your normal activities as told by your health care provider. Ask your health care provider what activities are safe for you. This information is not intended to replace advice given to you by your health care provider. Make sure you discuss any questions you have with your health care provider. Document Revised: 07/15/2017 Document Reviewed: 02/25/2017 Elsevier Patient Education  [place].

## 2019-12-12 ENCOUNTER — Other Ambulatory Visit: Payer: Self-pay | Admitting: Family Medicine

## 2019-12-12 DIAGNOSIS — R1011 Right upper quadrant pain: Secondary | ICD-10-CM

## 2019-12-12 DIAGNOSIS — R1901 Right upper quadrant abdominal swelling, mass and lump: Secondary | ICD-10-CM

## 2019-12-25 ENCOUNTER — Ambulatory Visit
Admission: RE | Admit: 2019-12-25 | Discharge: 2019-12-25 | Disposition: A | Discharge status: Home / Self Care | Payer: Managed Care, Other (non HMO) | Source: Ambulatory Visit | Attending: Family Medicine | Admitting: Family Medicine

## 2019-12-25 ENCOUNTER — Other Ambulatory Visit: Payer: Self-pay

## 2019-12-25 DIAGNOSIS — R1011 Right upper quadrant pain: Secondary | ICD-10-CM | POA: Diagnosis not present

## 2019-12-25 DIAGNOSIS — R1901 Right upper quadrant abdominal swelling, mass and lump: Secondary | ICD-10-CM | POA: Diagnosis present

## 2020-01-02 ENCOUNTER — Encounter: Payer: Self-pay | Admitting: *Deleted

## 2020-01-02 ENCOUNTER — Other Ambulatory Visit: Payer: Self-pay

## 2020-01-02 ENCOUNTER — Encounter: Payer: Managed Care, Other (non HMO) | Attending: Family Medicine | Admitting: *Deleted

## 2020-01-02 VITALS — BP 134/88 | Ht 67.0 in | Wt 248.7 lb

## 2020-01-02 DIAGNOSIS — E119 Type 2 diabetes mellitus without complications: Secondary | ICD-10-CM | POA: Diagnosis present

## 2020-01-02 DIAGNOSIS — Z713 Dietary counseling and surveillance: Secondary | ICD-10-CM | POA: Diagnosis not present

## 2020-01-02 NOTE — Patient Instructions (Signed)
Check blood sugars 1 x day before breakfast or 2 hrs after one meal every day Bring blood sugar records to the next class  Exercise: Begin walking for 15  minutes 3 days a week and gradually increase to 150 minutes/week  Eat 3 meals day, 1-2 snacks a day Space meals 4-6 hours apart Eat a protein serving when eating fruit for a snack  Return for classes on:

## 2020-01-03 NOTE — Progress Notes (Signed)
Diabetes Self-Management Education  Visit Type: First/Initial  Appt. Start Time: 1520 Appt. End Time: 1630  01/03/2020  [name], identified by name and date of birth, is a 49 y.o. female with a diagnosis of Diabetes: Type 2.   ASSESSMENT  Blood pressure 134/88, height 5\' 7"  (1.702 m), weight 248 lb 11.2 oz (112.8 kg). Body mass index is 38.95 kg/m.   Diabetes Self-Management Education - 01/02/20 1637      Visit Information   Visit Type First/Initial      Initial Visit   Diabetes Type Type 2    Are you currently following a meal plan? No   "trying to cut down on carbs"   Are you taking your medications as prescribed? Yes    Date Diagnosed 1 year ago      Health Coping   How would you rate your overall health? Good      Psychosocial Assessment   Patient Belief/Attitude about Diabetes Other (comment)   "stressed, anxious"   Self-care barriers None    Self-management support Doctor's office;Family    Patient Concerns Nutrition/Meal planning;Medication;Glycemic Control;Monitoring;Weight Control;Healthy Lifestyle    Special Needs None    Preferred Learning Style Auditory;Visual    Learning Readiness Change in progress    How often do you need to have someone help you when you read instructions, pamphlets, or other written materials from your doctor or pharmacy? 1 - Never    What is the last grade level you completed in school? college      Pre-Education Assessment   Patient understands the diabetes disease and treatment process. Needs Review    Patient understands incorporating nutritional management into lifestyle. Needs Instruction    Patient undertands incorporating physical activity into lifestyle. Needs Instruction    Patient understands using medications safely. Needs Review    Patient understands monitoring blood glucose, interpreting and using results Needs Review    Patient understands prevention, detection, and treatment of acute complications. Needs  Review    Patient understands prevention, detection, and treatment of chronic complications. Needs Review    Patient understands how to develop strategies to address psychosocial issues. Needs Instruction    Patient understands how to develop strategies to promote health/change behavior. Needs Instruction      Complications   Last HgB A1C per patient/outside source 7.9 %   11/06/2019   How often do you check your blood sugar? 0 times/day (not testing)   has a meter but needs to get new batteries   Have you had a dilated eye exam in the past 12 months? Yes    Have you had a dental exam in the past 12 months? Yes    Are you checking your feet? Yes    How many days per week are you checking your feet? 4      Dietary Intake   Breakfast peanut butter crackers; capn wafer    Lunch tunna sandwich or pimento cheese with wheat thins, fruit; left overs    Snack (afternoon) 0-1 snack/day - cheese stick, almonds, fruit - aroange, apple, strawberries, watermelon, peach, cantaloupe    Dinner chicken, beef and occasional pork and fish; potatoes, green beans, pasta, broccolli, salads - tomatoes, cuccumbers, carrots; occasional rice, corn and beans    Beverage(s) mostly water, sometimes unsweetened tea or crystal light      Exercise   Exercise Type ADL's      Patient Education   Previous Diabetes Education No    Disease state  Definition  of diabetes, type 1 and 2, and the diagnosis of diabetes;Factors that contribute to the development of diabetes    Nutrition management  Role of diet in the treatment of diabetes and the relationship between the three main macronutrients and blood glucose level;Food label reading, portion sizes and measuring food.;Reviewed blood glucose goals for pre and post meals and how to evaluate the patients' food intake on their blood glucose level.    Physical activity and exercise  Role of exercise on diabetes management, blood pressure control and cardiac health.    Medications  Reviewed patients medication for diabetes, action, purpose, timing of dose and side effects.    Monitoring Purpose and frequency of SMBG.;Taught/discussed recording of test results and interpretation of SMBG.;Identified appropriate SMBG and/or A1C goals.    Chronic complications Relationship between chronic complications and blood glucose control    Psychosocial adjustment Identified and addressed patients feelings and concerns about diabetes      Individualized Goals (developed by patient)   Reducing Risk Other (comment)   improve blood sugars, decrease medications, prevent diabetes complications, lose weight, lead a healthier lifestyle, become more fit     Outcomes   Expected Outcomes Demonstrated interest in learning. Expect positive outcomes           Individualized Plan for Diabetes Self-Management Training:   Learning Objective:  Patient will have a greater understanding of diabetes self-management. Patient education plan is to attend individual and/or group sessions per assessed needs and concerns.   Plan:   Patient Instructions  Check blood sugars 1 x day before breakfast or 2 hrs after one meal every day Bring blood sugar records to the next class Exercise: Begin walking for 15  minutes 3 days a week and gradually increase to 150 minutes/week Eat 3 meals day, 1-2 snacks a day Space meals 4-6 hours apart Eat a protein serving when eating fruit for a snack  Expected Outcomes:  Demonstrated interest in learning. Expect positive outcomes  Education material provided:  General Meal Planning Guidelines Simple Meal Plan  If problems or questions, patient to contact team via:  Johny Drilling, RN, [place] [phone]  Future DSME appointment:  January 07, 2020 for Diabetes Class 1

## 2020-01-07 ENCOUNTER — Telehealth: Payer: Self-pay | Admitting: *Deleted

## 2020-01-07 ENCOUNTER — Ambulatory Visit: Payer: Managed Care, Other (non HMO)

## 2020-01-07 NOTE — Telephone Encounter (Signed)
Patient called earlier and reported that she was leaving [place] today because of a funeral she attended over the week-end. She was not sure if she was going to make it back for class. Informed her that she could reschedule for the next series beginning July 19 if she didn't make it back. Patient was not able to get to class this evening.

## 2020-01-14 ENCOUNTER — Ambulatory Visit: Payer: Managed Care, Other (non HMO)

## 2020-01-21 ENCOUNTER — Ambulatory Visit: Payer: Managed Care, Other (non HMO)

## 2020-02-11 ENCOUNTER — Encounter: Payer: Self-pay | Admitting: Dietician

## 2020-02-11 ENCOUNTER — Encounter: Payer: Managed Care, Other (non HMO) | Attending: Family Medicine | Admitting: Dietician

## 2020-02-11 ENCOUNTER — Other Ambulatory Visit: Payer: Self-pay

## 2020-02-11 VITALS — Ht 67.0 in | Wt 247.3 lb

## 2020-02-11 DIAGNOSIS — Z713 Dietary counseling and surveillance: Secondary | ICD-10-CM | POA: Diagnosis not present

## 2020-02-11 DIAGNOSIS — E119 Type 2 diabetes mellitus without complications: Secondary | ICD-10-CM | POA: Insufficient documentation

## 2020-02-11 NOTE — Progress Notes (Signed)

## 2020-02-18 ENCOUNTER — Encounter: Payer: Managed Care, Other (non HMO) | Admitting: *Deleted

## 2020-02-18 ENCOUNTER — Encounter: Payer: Self-pay | Admitting: *Deleted

## 2020-02-18 ENCOUNTER — Other Ambulatory Visit: Payer: Self-pay

## 2020-02-18 VITALS — Wt 244.8 lb

## 2020-02-18 DIAGNOSIS — E1165 Type 2 diabetes mellitus with hyperglycemia: Secondary | ICD-10-CM

## 2020-02-18 DIAGNOSIS — E119 Type 2 diabetes mellitus without complications: Secondary | ICD-10-CM | POA: Diagnosis not present

## 2020-02-18 NOTE — Progress Notes (Signed)

## 2020-02-25 ENCOUNTER — Encounter: Payer: Managed Care, Other (non HMO) | Attending: Family Medicine | Admitting: Dietician

## 2020-02-25 ENCOUNTER — Encounter: Payer: Self-pay | Admitting: Dietician

## 2020-02-25 ENCOUNTER — Other Ambulatory Visit: Payer: Self-pay

## 2020-02-25 VITALS — BP 126/90 | Ht 67.0 in | Wt 242.4 lb

## 2020-02-25 DIAGNOSIS — E119 Type 2 diabetes mellitus without complications: Secondary | ICD-10-CM | POA: Insufficient documentation

## 2020-02-25 DIAGNOSIS — E1165 Type 2 diabetes mellitus with hyperglycemia: Secondary | ICD-10-CM

## 2020-02-25 DIAGNOSIS — Z713 Dietary counseling and surveillance: Secondary | ICD-10-CM | POA: Diagnosis not present

## 2020-02-25 NOTE — Progress Notes (Signed)

## 2020-03-03 ENCOUNTER — Encounter: Payer: Self-pay | Admitting: *Deleted

## 2020-03-06 ENCOUNTER — Other Ambulatory Visit
Admission: RE | Admit: 2020-03-06 | Discharge: 2020-03-06 | Disposition: A | Discharge status: Home / Self Care | Payer: Managed Care, Other (non HMO) | Source: Ambulatory Visit | Attending: Gastroenterology | Admitting: Gastroenterology

## 2020-03-06 ENCOUNTER — Other Ambulatory Visit: Payer: Self-pay

## 2020-03-06 DIAGNOSIS — Z20822 Contact with and (suspected) exposure to covid-19: Secondary | ICD-10-CM | POA: Diagnosis not present

## 2020-03-06 DIAGNOSIS — Z01812 Encounter for preprocedural laboratory examination: Secondary | ICD-10-CM | POA: Diagnosis present

## 2020-03-07 ENCOUNTER — Other Ambulatory Visit: Payer: Self-pay | Admitting: Obstetrics and Gynecology

## 2020-03-07 DIAGNOSIS — Z1231 Encounter for screening mammogram for malignant neoplasm of breast: Secondary | ICD-10-CM

## 2020-03-07 LAB — SARS CORONAVIRUS 2 (TAT 6-24 HRS): SARS Coronavirus 2: NEGATIVE

## 2020-03-10 ENCOUNTER — Ambulatory Visit: Payer: Managed Care, Other (non HMO) | Admitting: Anesthesiology

## 2020-03-10 ENCOUNTER — Encounter: Payer: Self-pay | Admitting: Anesthesiology

## 2020-03-10 ENCOUNTER — Ambulatory Visit
Admission: RE | Admit: 2020-03-10 | Discharge: 2020-03-10 | Disposition: A | Discharge status: Home / Self Care | Payer: Managed Care, Other (non HMO) | Attending: Gastroenterology | Admitting: Gastroenterology

## 2020-03-10 ENCOUNTER — Other Ambulatory Visit: Payer: Self-pay

## 2020-03-10 ENCOUNTER — Encounter
Admission: RE | Disposition: A | Discharge status: Home / Self Care | Payer: Self-pay | Source: Home / Self Care | Attending: Gastroenterology

## 2020-03-10 DIAGNOSIS — Z7984 Long term (current) use of oral hypoglycemic drugs: Secondary | ICD-10-CM | POA: Insufficient documentation

## 2020-03-10 DIAGNOSIS — K2270 Barrett's esophagus without dysplasia: Secondary | ICD-10-CM | POA: Insufficient documentation

## 2020-03-10 DIAGNOSIS — Z79899 Other long term (current) drug therapy: Secondary | ICD-10-CM | POA: Diagnosis not present

## 2020-03-10 DIAGNOSIS — K317 Polyp of stomach and duodenum: Secondary | ICD-10-CM | POA: Diagnosis not present

## 2020-03-10 DIAGNOSIS — K219 Gastro-esophageal reflux disease without esophagitis: Secondary | ICD-10-CM | POA: Diagnosis not present

## 2020-03-10 DIAGNOSIS — F419 Anxiety disorder, unspecified: Secondary | ICD-10-CM | POA: Insufficient documentation

## 2020-03-10 DIAGNOSIS — Z87891 Personal history of nicotine dependence: Secondary | ICD-10-CM | POA: Diagnosis not present

## 2020-03-10 DIAGNOSIS — I10 Essential (primary) hypertension: Secondary | ICD-10-CM | POA: Insufficient documentation

## 2020-03-10 DIAGNOSIS — K640 First degree hemorrhoids: Secondary | ICD-10-CM | POA: Insufficient documentation

## 2020-03-10 DIAGNOSIS — E119 Type 2 diabetes mellitus without complications: Secondary | ICD-10-CM | POA: Insufficient documentation

## 2020-03-10 DIAGNOSIS — G4730 Sleep apnea, unspecified: Secondary | ICD-10-CM | POA: Insufficient documentation

## 2020-03-10 DIAGNOSIS — Z86010 Personal history of colonic polyps: Secondary | ICD-10-CM | POA: Insufficient documentation

## 2020-03-10 DIAGNOSIS — Z1211 Encounter for screening for malignant neoplasm of colon: Secondary | ICD-10-CM | POA: Insufficient documentation

## 2020-03-10 HISTORY — PX: ESOPHAGOGASTRODUODENOSCOPY: SHX5428

## 2020-03-10 HISTORY — PX: COLONOSCOPY WITH PROPOFOL: SHX5780

## 2020-03-10 LAB — GLUCOSE, CAPILLARY: Glucose-Capillary: 179 mg/dL — ABNORMAL HIGH (ref 70–99)

## 2020-03-10 SURGERY — EGD (ESOPHAGOGASTRODUODENOSCOPY)
Anesthesia: General

## 2020-03-10 MED ORDER — FENTANYL CITRATE (PF) 100 MCG/2ML IJ SOLN: 25.0000 ug | INTRAMUSCULAR | Status: DC | PRN

## 2020-03-10 MED ORDER — SODIUM CHLORIDE 0.9 % IV SOLN
INTRAVENOUS | Status: DC
  Administered 2020-03-10: 1000 mL via INTRAVENOUS

## 2020-03-10 MED ORDER — ONDANSETRON HCL 4 MG/2ML IJ SOLN
INTRAMUSCULAR | Status: AC
  Filled 2020-03-10: qty 2

## 2020-03-10 MED ORDER — LIDOCAINE HCL (CARDIAC) PF 100 MG/5ML IV SOSY
PREFILLED_SYRINGE | INTRAVENOUS | Status: DC | PRN
  Administered 2020-03-10: 50 mg via INTRAVENOUS

## 2020-03-10 MED ORDER — PROPOFOL 10 MG/ML IV BOLUS
INTRAVENOUS | Status: AC
  Filled 2020-03-10: qty 20

## 2020-03-10 MED ORDER — PROPOFOL 500 MG/50ML IV EMUL
INTRAVENOUS | Status: DC | PRN
  Administered 2020-03-10: 150 ug/kg/min via INTRAVENOUS

## 2020-03-10 MED ORDER — ONDANSETRON HCL 4 MG/2ML IJ SOLN
4.0000 mg | Freq: Once | INTRAMUSCULAR | Status: AC | PRN
  Administered 2020-03-10: 4 mg via INTRAVENOUS

## 2020-03-10 MED ORDER — LIDOCAINE HCL (PF) 2 % IJ SOLN
INTRAMUSCULAR | Status: AC
  Filled 2020-03-10: qty 5

## 2020-03-10 MED ORDER — PROPOFOL 500 MG/50ML IV EMUL
INTRAVENOUS | Status: AC
  Filled 2020-03-10: qty 50

## 2020-03-10 MED ORDER — FENTANYL CITRATE (PF) 100 MCG/2ML IJ SOLN
INTRAMUSCULAR | Status: AC
  Filled 2020-03-10: qty 2

## 2020-03-10 NOTE — H&P (Signed)
Outpatient short stay form Pre-procedure 03/10/2020 7:55 AM Julia Miyamoto MD, MPH  Primary Physician: Dr. 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 History of three small TA's here for colonoscopy surveillance. No family history of GI malignancies. No blood thinners. Hx of c-section and tubal ligation.    Current Facility-Administered Medications:  .  0.9 %  sodium chloride infusion, , Intravenous, Continuous, Gaylord Seydel, Hilton Cork, MD, Last Rate: 20 mL/hr at 03/10/20 0753, 1,000 mL at 03/10/20 0753 .  fentaNYL (SUBLIMAZE) injection 25 mcg, 25 mcg, Intravenous, Q5 min PRN, Alvin Critchley, MD .  ondansetron [facility]) injection 4 mg, 4 mg, Intravenous, Once PRN, Alvin Critchley, MD  Medications Prior to Admission  Medication Sig Dispense Refill Last Dose  . ALPRAZolam (XANAX) 0.5 MG tablet Take 1 tablet (0.5 mg total) by mouth 3 (three) times daily as needed for anxiety. 45 tablet 2 Past Week at Unknown time  . cetirizine (ZYRTEC) 10 MG tablet Take 10 mg by mouth daily.   Past Week at Unknown time  . citalopram (CELEXA) 10 MG tablet Take 10 mg by mouth daily.   Past Week at Unknown time  . clobetasol cream (TEMOVATE) 4.09 % Apply 1 application topically 2 (two) times daily. 60 g 3 Past Week at Unknown time  . cyclobenzaprine (FLEXERIL) 10 MG tablet Take 1 tablet by mouth at bedtime as needed.   Past Week at Unknown time  . metFORMIN (GLUCOPHAGE-XR) 500 MG 24 hr tablet Take 1,000 mg by mouth 2 (two) times daily.   Past Week at Unknown time  . pantoprazole (PROTONIX) 40 MG tablet Take 40 mg by mouth daily.   Past Week at Unknown time  . Vitamin D, Ergocalciferol, (DRISDOL) 50000 units CAPS capsule Take 1 capsule (50,000 Units total) by mouth every 7 (seven) days. 12 capsule 4 Past Week at Unknown time  . zolpidem (AMBIEN) 10 MG tablet  TAKE 1 TABLET AT BEDTIME AS NEEDED FOR SLEEP 30 tablet 2 03/09/2020 at Unknown time  . fluconazole (DIFLUCAN) 150 MG tablet Take 1 tablet by mouth daily as needed.     . hydrocortisone-pramoxine (ANALPRAM-HC) 2.5-1 % rectal cream Place 1 application rectally at bedtime.     Marland Kitchen MIRALAX 17 GM/SCOOP powder Take by mouth.     . nystatin (MYCOSTATIN/NYSTOP) powder Apply topically.     . vitamin B-12 (CYANOCOBALAMIN) 1000 MCG tablet Take 2,000 mcg by mouth daily.         Allergies  Allergen Reactions  . Amoxicillin-Pot Clavulanate Diarrhea  . Hydrocodone-Acetaminophen Nausea Only  . Rosuvastatin Other (See Comments)     Past Medical History:  Diagnosis Date  . Anxiety   . Back pain   . Complication of anesthesia   . Diabetes mellitus without complication ([place])   . Family history of adverse reaction to anesthesia    DAD-NAUSEATED   . GERD (gastroesophageal reflux disease)    Barrett's esophagus  . Headache    H/O MIGRAINES  . Hypertension   . PONV (postoperative nausea and vomiting)    NAUSEATED  . Sleep apnea    CPAP  . Wears contact lenses     Review of systems:  Otherwise negative.    Physical Exam  Gen: Alert, oriented. Appears stated age.  HEENT: /AT. PERRLA. Lungs: No respiratory distress Abd: soft, benign, no masses. Ext: No edema.  Pulses 2+    Planned procedures: Proceed with EGD/colonoscopy. The patient understands the nature of the planned procedure, indications, risks, alternatives and potential complications including but not limited to bleeding, infection, perforation, damage to internal organs and possible oversedation/side effects from anesthesia. The patient agrees and gives consent to proceed.  Please refer to procedure notes for findings, recommendations and patient disposition/instructions.     Julia Miyamoto MD, MPH Gastroenterology 03/10/2020  7:55 AM

## 2020-03-10 NOTE — Op Note (Signed)
[facility] Gastroenterology Patient Name: Julia Nunez Procedure Date: 03/10/2020 8:03 AM MRN: [id number] Account #: [id number] Date of Birth: May 19, 1971 Admit Type: Outpatient Age: 49 Room: [facility] ENDO ROOM 3 Gender: Female Note Status: Finalized Procedure:             Upper GI endoscopy Indications:           Barrett's esophagus Providers:             Andrey Farmer MD, MD Medicines:             Monitored Anesthesia Care Complications:         No immediate complications. Estimated blood loss:                         Minimal. Procedure:             Pre-Anesthesia Assessment:                        - Prior to the procedure, a History and Physical was                         performed, and patient medications and allergies were                         reviewed. The patient is competent. The risks and                         benefits of the procedure and the sedation options and                         risks were discussed with the patient. All questions                         were answered and informed consent was obtained.                         Patient identification and proposed procedure were                         verified by the physician, the nurse, the anesthetist                         and the technician in the endoscopy suite. Mental                         Status Examination: alert and oriented. Airway                         Examination: normal oropharyngeal airway and neck                         mobility. Respiratory Examination: clear to                         auscultation. CV Examination: normal. Prophylactic                         Antibiotics: The patient does not require prophylactic  antibiotics. Prior Anticoagulants: The patient has                         taken no previous anticoagulant or antiplatelet                         agents. ASA Grade Assessment: II - A patient with mild                         systemic  disease. After reviewing the risks and                         benefits, the patient was deemed in satisfactory                         condition to undergo the procedure. The anesthesia                         plan was to use monitored anesthesia care (MAC).                         Immediately prior to administration of medications,                         the patient was re-assessed for adequacy to receive                         sedatives. The heart rate, respiratory rate, oxygen                         saturations, blood pressure, adequacy of pulmonary                         ventilation, and response to care were monitored                         throughout the procedure. The physical status of the                         patient was re-assessed after the procedure.                        After obtaining informed consent, the endoscope was                         passed under direct vision. Throughout the procedure,                         the patient's blood pressure, pulse, and oxygen                         saturations were monitored continuously. The Endoscope                         was introduced through the mouth, and advanced to the                         second part of duodenum. The upper GI endoscopy was  accomplished without difficulty. The patient tolerated                         the procedure well. Findings:      There were esophageal mucosal changes secondary to established       short-segment Barrett's disease present in the lower third of the       esophagus. These were characterized by two very small islands of gastric       mucosa. The maximum longitudinal extent of these mucosal changes was 1       cm in length. Mucosa was biopsied with a cold forceps for histology in 4       quadrants in the lower third of the esophagus. One specimen bottle was       sent to pathology. Estimated blood loss was minimal.      Multiple 2 to 15 mm sessile  fundic gland polyps with no stigmata of       recent bleeding were found in the gastric body. The polyp was removed       with a cold snare. Resection and retrieval were complete. Estimated       blood loss was minimal.      The examined duodenum was normal. Impression:            - Esophageal mucosal changes secondary to established                         short-segment Barrett's disease. Biopsied.                        - Multiple fundic gland polyps. Resected and retrieved.                        - Normal examined duodenum. Recommendation:        - Discharge patient to home.                        - Resume previous diet.                        - Continue present medications.                        - Await pathology results.                        - Repeat upper endoscopy after studies are complete                         for surveillance of Barrett's esophagus.                        - Return to referring physician as previously                         scheduled. Procedure Code(s):     --- Professional ---                        67672, 59, Esophagogastroduodenoscopy, flexible,                         transoral; with removal of  tumor(s), polyp(s), or                         other lesion(s) by snare technique Diagnosis Code(s):     --- Professional ---                        K22.70, Barrett's esophagus without dysplasia                        K31.7, Polyp of stomach and duodenum CPT copyright 2019 American [employer]. All rights reserved. The codes documented in this report are preliminary and upon coder review may  be revised to meet current compliance requirements. Andrey Farmer, MD Andrey Farmer MD, MD 03/10/2020 8:48:10 AM Number of Addenda: 0 Note Initiated On: 03/10/2020 8:03 AM Estimated Blood Loss:  Estimated blood loss was minimal.      [facility]

## 2020-03-10 NOTE — Anesthesia Preprocedure Evaluation (Signed)
Anesthesia Evaluation  Patient identified by MRN, date of birth, ID band Patient awake    Reviewed: Allergy & Precautions, H&P , NPO status , Patient's Chart, lab work & pertinent test results, reviewed documented beta blocker date and time   History of Anesthesia Complications Negative for: history of anesthetic complications  Airway Mallampati: I  TM Distance: >3 FB Neck ROM: full    Dental  (+) Teeth Intact   Pulmonary former smoker,    Pulmonary exam normal breath sounds clear to auscultation       Cardiovascular Exercise Tolerance: Good hypertension, + angina at rest (-) CAD, (-) Past MI, (-) Cardiac Stents and (-) 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) Normal cardiovascular exam(-) dysrhythmias (-) Valvular Problems/Murmurs Rhythm:regular Rate:Normal     Neuro/Psych negative neurological ROS  negative psych ROS   GI/Hepatic Neg liver ROS, GERD  Medicated,  Endo/Other  negative endocrine ROSdiabetes  Renal/GU negative Renal ROS  negative genitourinary   Musculoskeletal   Abdominal   Peds  Hematology negative hematology ROS (+)   Anesthesia Other Findings Past Medical History:   Anxiety                                                      Hypertension                                                 Back pain                                                    Reproductive/Obstetrics negative OB ROS                             Anesthesia Physical  Anesthesia Plan  ASA: II  Anesthesia Plan: General   Post-op Pain Management:    Induction:   PONV Risk Score and Plan: Propofol infusion  Airway Management Planned: Nasal Cannula  Additional Equipment:   Intra-op Plan:   Post-operative Plan:   Informed Consent: I have reviewed the patients History and Physical, chart, labs and discussed the procedure including the risks, benefits and alternatives for the proposed anesthesia with  the patient or authorized representative who has indicated his/her understanding and acceptance.     Dental Advisory Given  Plan Discussed with: Anesthesiologist, CRNA and Surgeon  Anesthesia Plan Comments:         Anesthesia Quick Evaluation

## 2020-03-10 NOTE — Anesthesia Procedure Notes (Signed)
Performed by: Cook-Martin, Donathan Buller Pre-anesthesia Checklist: Patient identified, Emergency Drugs available, Suction available, Patient being monitored and Timeout performed Patient Re-evaluated:Patient Re-evaluated prior to induction Oxygen Delivery Method: Nasal cannula Preoxygenation: Pre-oxygenation with 100% oxygen Induction Type: IV induction Airway Equipment and Method: Bite block Placement Confirmation: positive ETCO2 and CO2 detector       

## 2020-03-10 NOTE — Interval H&P Note (Signed)
History and Physical Interval Note:  03/10/2020 7:57 AM  Julia Nunez  has presented today for surgery, with the diagnosis of BARRETT'S ESOPHAGUS,PERSONAL HX.OF COLON POLYPS.  The various methods of treatment have been discussed with the patient and family. After consideration of risks, benefits and other options for treatment, the patient has consented to  Procedure(s): ESOPHAGOGASTRODUODENOSCOPY (EGD) (N/A) COLONOSCOPY WITH PROPOFOL (N/A) as a surgical intervention.  The patient's history has been reviewed, patient examined, no change in status, stable for surgery.  I have reviewed the patient's chart and labs.  Questions were answered to the patient's satisfaction.     [name]  Ok to proceed with EGD/Colonoscopy

## 2020-03-10 NOTE — Transfer of Care (Signed)
Immediate Anesthesia Transfer of Care Note  Patient: Julia Nunez  Procedure(s) Performed: ESOPHAGOGASTRODUODENOSCOPY (EGD) (N/A ) COLONOSCOPY WITH PROPOFOL (N/A )  Patient Location: PACU  Anesthesia Type:General  Level of Consciousness: awake and sedated  Airway & Oxygen Therapy: Patient Spontanous Breathing and Patient connected to face mask oxygen  Post-op Assessment: Report given to RN and Post -op Vital signs reviewed and stable  Post vital signs: Reviewed and stable  Last Vitals:  Vitals Value Taken Time  BP    Temp    Pulse    Resp    SpO2      Last Pain:  Vitals:   03/10/20 0738  TempSrc: Temporal  PainSc: 0-No pain      Patients Stated Pain Goal: 0 (53/66/44 0347)  Complications: No complications documented.

## 2020-03-10 NOTE — Anesthesia Postprocedure Evaluation (Signed)
Anesthesia Post Note  Patient: Julia Nunez  Procedure(s) Performed: ESOPHAGOGASTRODUODENOSCOPY (EGD) (N/A ) COLONOSCOPY WITH PROPOFOL (N/A )  Patient location during evaluation: Endoscopy Anesthesia Type: General Level of consciousness: awake and alert and oriented Pain management: pain level controlled Vital Signs Assessment: post-procedure vital signs reviewed and stable Respiratory status: spontaneous breathing Cardiovascular status: blood pressure returned to baseline Anesthetic complications: no   No complications documented.   Last Vitals:  Vitals:   03/10/20 0908 03/10/20 0918  BP: 127/87 (!) 134/91  Pulse: 87 78  Resp: 14 14  Temp:    SpO2: 100% 98%    Last Pain:  Vitals:   03/10/20 0918  TempSrc:   PainSc: 0-No pain                 Deshae Dickison

## 2020-03-10 NOTE — Op Note (Signed)
[facility] Gastroenterology Patient Name: Julia Nunez Procedure Date: 03/10/2020 8:03 AM MRN: [id number] Account #: [id number] Date of Birth: 1971/02/20 Admit Type: Outpatient Age: 49 Room: [facility] ENDO ROOM 3 Gender: Female Note Status: Finalized Procedure:             Colonoscopy Indications:           High risk colon cancer surveillance: Personal history                         of multiple (3 or more) adenomas Providers:             Andrey Farmer MD, MD Medicines:             Monitored Anesthesia Care Complications:         No immediate complications. Procedure:             Pre-Anesthesia Assessment:                        - Prior to the procedure, a History and Physical was                         performed, and patient medications and allergies were                         reviewed. The patient is competent. The risks and                         benefits of the procedure and the sedation options and                         risks were discussed with the patient. All questions                         were answered and informed consent was obtained.                         Patient identification and proposed procedure were                         verified by the physician, the nurse, the anesthetist                         and the technician in the endoscopy suite. Mental                         Status Examination: alert and oriented. Airway                         Examination: normal oropharyngeal airway and neck                         mobility. Respiratory Examination: clear to                         auscultation. CV Examination: normal. Prophylactic                         Antibiotics: The patient does not require prophylactic  antibiotics. Prior Anticoagulants: The patient has                         taken no previous anticoagulant or antiplatelet                         agents. ASA Grade Assessment: II - A patient with mild                          systemic disease. After reviewing the risks and                         benefits, the patient was deemed in satisfactory                         condition to undergo the procedure. The anesthesia                         plan was to use monitored anesthesia care (MAC).                         Immediately prior to administration of medications,                         the patient was re-assessed for adequacy to receive                         sedatives. The heart rate, respiratory rate, oxygen                         saturations, blood pressure, adequacy of pulmonary                         ventilation, and response to care were monitored                         throughout the procedure. The physical status of the                         patient was re-assessed after the procedure.                        After obtaining informed consent, the colonoscope was                         passed under direct vision. Throughout the procedure,                         the patient's blood pressure, pulse, and oxygen                         saturations were monitored continuously. The                         Colonoscope was introduced through the anus and                         advanced to the the cecum, identified by appendiceal  orifice and ileocecal valve. The colonoscopy was                         technically difficult and complex due to a redundant                         colon. The patient tolerated the procedure well. The                         quality of the bowel preparation was good. Findings:      The perianal and digital rectal examinations were normal.      Non-bleeding internal hemorrhoids were found during retroflexion. The       hemorrhoids were Grade I (internal hemorrhoids that do not prolapse).      The exam was otherwise without abnormality on direct and retroflexion       views. Impression:            - Non-bleeding internal  hemorrhoids.                        - The examination was otherwise normal on direct and                         retroflexion views.                        - No specimens collected. Recommendation:        - Discharge patient to home.                        - Resume previous diet.                        - Continue present medications.                        - Repeat colonoscopy in 10 years for surveillance.                        - Return to referring physician as previously                         scheduled. Procedure Code(s):     --- Professional ---                        J0932, Colorectal cancer screening; colonoscopy on                         individual at high risk Diagnosis Code(s):     --- Professional ---                        Z86.010, Personal history of colonic polyps                        K64.0, First degree hemorrhoids CPT copyright 2019 American Medical Association. All rights reserved. The codes documented in this report are preliminary and upon coder review may  be revised to meet current compliance requirements. Andrey Farmer, MD Andrey Farmer MD, MD 03/10/2020 8:50:44 AM Number of Addenda: 0 Note Initiated On:  03/10/2020 8:03 AM Scope Withdrawal Time: 0 hours 6 minutes 57 seconds  Total Procedure Duration: 0 hours 23 minutes 34 seconds  Estimated Blood Loss:  Estimated blood loss: none.      [facility]

## 2020-03-11 ENCOUNTER — Encounter: Payer: Self-pay | Admitting: Gastroenterology

## 2020-03-11 LAB — SURGICAL PATHOLOGY

## 2020-03-26 ENCOUNTER — Other Ambulatory Visit: Payer: Self-pay

## 2020-03-26 ENCOUNTER — Ambulatory Visit
Admission: RE | Admit: 2020-03-26 | Discharge: 2020-03-26 | Disposition: A | Discharge status: Home / Self Care | Payer: Managed Care, Other (non HMO) | Source: Ambulatory Visit | Attending: Obstetrics and Gynecology | Admitting: Obstetrics and Gynecology

## 2020-03-26 DIAGNOSIS — Z1231 Encounter for screening mammogram for malignant neoplasm of breast: Secondary | ICD-10-CM | POA: Insufficient documentation

## 2020-06-05 ENCOUNTER — Other Ambulatory Visit: Payer: Self-pay | Admitting: Otolaryngology

## 2020-06-05 DIAGNOSIS — R430 Anosmia: Secondary | ICD-10-CM

## 2020-06-18 ENCOUNTER — Other Ambulatory Visit: Payer: Self-pay

## 2020-06-18 ENCOUNTER — Ambulatory Visit
Admission: RE | Admit: 2020-06-18 | Discharge: 2020-06-18 | Disposition: A | Discharge status: Home / Self Care | Payer: Managed Care, Other (non HMO) | Source: Ambulatory Visit | Attending: Otolaryngology | Admitting: Otolaryngology

## 2020-06-18 DIAGNOSIS — R430 Anosmia: Secondary | ICD-10-CM | POA: Diagnosis present

## 2020-06-18 MED ORDER — GADOBUTROL 1 MMOL/ML IV SOLN
10.0000 mL | Freq: Once | INTRAVENOUS | Status: AC | PRN
  Administered 2020-06-18: 10 mL via INTRAVENOUS

## 2021-04-15 ENCOUNTER — Other Ambulatory Visit: Payer: Self-pay | Admitting: Obstetrics and Gynecology

## 2021-04-15 DIAGNOSIS — Z1231 Encounter for screening mammogram for malignant neoplasm of breast: Secondary | ICD-10-CM

## 2021-05-05 ENCOUNTER — Ambulatory Visit
Admission: RE | Admit: 2021-05-05 | Discharge: 2021-05-05 | Disposition: A | Discharge status: Home / Self Care | Payer: Managed Care, Other (non HMO) | Source: Ambulatory Visit | Attending: Obstetrics and Gynecology | Admitting: Obstetrics and Gynecology

## 2021-05-05 ENCOUNTER — Other Ambulatory Visit: Payer: Self-pay

## 2021-05-05 DIAGNOSIS — Z1231 Encounter for screening mammogram for malignant neoplasm of breast: Secondary | ICD-10-CM | POA: Diagnosis not present

## 2021-11-04 ENCOUNTER — Encounter: Payer: Self-pay | Admitting: Physical Therapy

## 2021-11-04 ENCOUNTER — Ambulatory Visit: Payer: Managed Care, Other (non HMO) | Attending: Urology | Admitting: Physical Therapy

## 2021-11-04 DIAGNOSIS — M62838 Other muscle spasm: Secondary | ICD-10-CM | POA: Diagnosis present

## 2021-11-04 DIAGNOSIS — R278 Other lack of coordination: Secondary | ICD-10-CM | POA: Diagnosis present

## 2021-11-04 DIAGNOSIS — R102 Pelvic and perineal pain unspecified side: Secondary | ICD-10-CM

## 2021-11-04 NOTE — Therapy (Signed)
?OUTPATIENT PHYSICAL THERAPY FEMALE PELVIC EVALUATION ? ? ?Patient Name: Julia Nunez ?MRN: [id number] ?DOB:[birth year], 51 y.o., female ?Today's Date: 11/04/2021 ? ? PT End of Session - 11/04/21 1713   ? ? Visit Number 1   ? Number of Visits 8   ? Date for PT Re-Evaluation 12/30/21   ? PT Start Time 0623   ? PT Stop Time 1750   ? PT Time Calculation (min) 35 min   ? Activity Tolerance Patient tolerated treatment well   ? Behavior During Therapy [facility] for tasks assessed/performed   ? ?  ?  ? ?  ? ? ?Past Medical History:  ?Diagnosis Date  ? Anxiety   ? Back pain   ? Complication of anesthesia   ? Diabetes mellitus without complication ([place])   ? Family history of adverse reaction to anesthesia   ? DAD-NAUSEATED   ? GERD (gastroesophageal reflux disease)   ? Barrett's esophagus  ? Headache   ? H/O MIGRAINES  ? Hypertension   ? PONV (postoperative nausea and vomiting)   ? NAUSEATED  ? Sleep apnea   ? CPAP  ? Wears contact lenses   ? ?Past Surgical History:  ?Procedure Laterality Date  ? CARDIAC CATHETERIZATION    ? no stents  ? [place]  ? X1  ? COLONOSCOPY WITH PROPOFOL N/A 03/27/2015  ? Procedure: COLONOSCOPY WITH PROPOFOL;  Surgeon: Manya Silvas, MD;  Location: [facility] ENDOSCOPY;  Service: Endoscopy;  Laterality: N/A;  ? COLONOSCOPY WITH PROPOFOL N/A 03/10/2020  ? Procedure: COLONOSCOPY WITH PROPOFOL;  Surgeon: [name], MD;  Location: [facility] ENDOSCOPY;  Service: Endoscopy;  Laterality: N/A;  ? CYSTECTOMY    ? SEBACEOUS CYST ON SCALP  ? CYSTOSCOPY    ? ESOPHAGOGASTRODUODENOSCOPY N/A 03/27/2015  ? Procedure: ESOPHAGOGASTRODUODENOSCOPY (EGD);  Surgeon: Manya Silvas, MD;  Location: [facility] ENDOSCOPY;  Service: Endoscopy;  Laterality: N/A;  ? ESOPHAGOGASTRODUODENOSCOPY N/A 03/10/2020  ? Procedure: ESOPHAGOGASTRODUODENOSCOPY (EGD);  Surgeon: [name], MD;  Location: [facility] ENDOSCOPY;  Service: Endoscopy;  Laterality: N/A;  ? LAPAROSCOPIC BILATERAL SALPINGECTOMY Bilateral 05/24/2016  ? Procedure:  LAPAROSCOPIC BILATERAL TUBAL BANDING VIA FALOPE RINGS;  Surgeon: Brayton Mars, MD;  Location: [facility] ORS;  Service: Gynecology;  Laterality: Bilateral;  ? SAVORY DILATION N/A 03/27/2015  ? Procedure: SAVORY DILATION;  Surgeon: Manya Silvas, MD;  Location: [facility] ENDOSCOPY;  Service: Endoscopy;  Laterality: N/A;  ? TUBAL LIGATION    ? ?Patient Active Problem List  ? Diagnosis Date Noted  ? Status post tubal ligation 06/02/2016  ? Anxiety 12/12/2014  ? Borderline diabetes 12/12/2014  ? Adiposity 12/12/2014  ? Avitaminosis D 12/12/2014  ? ? ?PCP: Sofie Hartigan, MD ? ?REFERRING PROVIDER: Ardis Hughs, MD ? ?REFERRING DIAG: R10.2 (ICD-10-CM) - Pelvic and perineal pain ?R39.82 (ICD-10-CM) - Chronic bladder pain ?R30.0 (ICD-10-CM) - Dysuria ? ?THERAPY DIAG:  ?Pelvic pain ? ?Other lack of coordination ? ?Other muscle spasm ? ?ONSET DATE: longstanding ? ?SUBJECTIVE:                                                                                                                                                                                          ? ?  Chief complaint: Patient notes longstanding history with kidney issues. Patient had UTI which was treated and symptoms persisted. Patient then initiated care with urology. Patient reports that she feels her vagina is on fire all the time. Patient also had follow up with GYN and was found to have an inflamed cervix. Patient was treated for BV and yeast and then had pelvic pain. Patient notes ovaries felt like they were hurting. Patient had recent US which showed cysts on cervix and R ovary. Patient endorses pain B, L>R. Patient notes that she has bleeding with sex now which is new. Patient has been using vaginal valium but had increased morning fatigue. GYN is concerned about cysts and will have follow-up US in 4 months. Patient is also awaiting bloodwork results. Patient initiated iron supplement which has slowed bowels. Patient notes sensation of rawness on  the inside. ? ? ?Pain: ?Are you having pain? Yes ?NPRS scale: 2/10; worst 6/10, least 0/10 ?Pain location:  B, L>R, LQ, suprapubically ? ?Pain type: sharp and shooting; dull ache ?Pain description: constant ; will worsen intermittently  ? ?Aggravating factors: penetration, urination, tampon use ?Relieving factors: vaginal valium,  ? ?Precautions: None ? ?Falls: Has patient fallen in last 6 months? Yes. Number of falls 1 off a ladder. Landed on couch. ? ?Occupation/Current Activities: CMA ? ?PLOF: Independent ? ?Pertinent History: ?Scoliosis Negative. ?Pulmonary disease/dysfunction Negative. ?Surgical history: Positive for c-section, B saplingectomy, tubal ligation. ? ?Obstetrical History: ?G3P2 ?Deliveries: SVD, c-section ?Tearing/Episiotomy: grade 3-4 (?) ? ?Gynecological History: ?Hysterectomy: No Vaginal/Abdominal ?Endometriosis: Negative ?Pelvic Organ Prolapse: Negative ?Last Menstrual Period:  ?Pain with exam: Yes ?Heaviness/pressure: No ? ?Urinary History: ?Frequency of urination: every 4 hours ?Incontinence: Coughing and Sneezing; notes rare occurrence ?Amount: Min/Mod. ?Protective undergarments: No   ?Fluid Intake: 3x16 oz H20, no caffeinated, occasional unsweet tea, occasional flavored, rare diet soda ?Nocturia: 0-1x/night ?Toileting posture: heels elevated ?Incomplete emptying: Yes: able to bend forward and use Crede's maneuver  ?Pain with urination: Positive for 50% ?Stream: Strong and Weak ?Urgency: No ?Difficulty initiating urination: Positive ?Intermittent stream: Positive ?Frequent UTI: Positive. ? ?Gastrointestinal History: ?Type of bowel movement:Type (Bristol Stool Scale) 4; occasional 1-2 ?Frequency of BMs: 4x/day to every other day ?Incomplete bowel movement: Yes: sometimes ?Pain with defecation: Negative ?Straining with defecation: Positive for occasional. ?Hemorrhoids: Negative ?Fiber supplement: No ?Incontinence: Negative.  ? ? ?Sexual History: ?Pain with penetration: During Penetration and  After Intercourse ?Pain with external stimulation: No ?Change in ability to achieve orgasm: Yes:   ?Sexual abuse: No ? ? ?OBJECTIVE:  ? ?COGNITION: ?Overall cognitive status: Within functional limits for tasks assessed   ?  ?POSTURE/OBSERVATIONS:  ?Patient sits with increased hip adduction B, at times crossing midline. Heels elevated throughout.  ?Lumbar lordosis: appearing diminished ?Thoracic kyphosis: increased ?Iliac crest height: not formally assessed  ?Lumbar lateral shift: not formally assessed  ?Pelvic obliquity: not formally assessed  ?Leg length discrepancy: not formally assessed  ? ?GAIT: ?Grossly WFL. ? ?RANGE OF MOTION: deferred 2/2 to time constraints ?  LEFT RIGHT  ?Lumbar forward flexion (65):      ?Lumbar extension (30):     ?Lumbar lateral flexion (25):     ?Thoracic and Lumbar rotation (30 degrees):       ?Hip Flexion (0-125):      ?Hip IR (0-45):     ?Hip ER (0-45):     ?Hip Abduction (0-40):     ?Hip extension (0-15):     ? ?SENSATION:deferred 2/2 to time constraints ?Grossly intact to light  touch bilateral LEs as determined by testing dermatomes L2-S2 ?Proprioception and hot/cold testing deferred on this date ? ?STRENGTH: MMT deferred 2/2 to time constraints ? RLE LLE  ?Hip Flexion    ?Hip Extension    ?Hip Abduction     ?Hip Adduction     ?Hip ER     ?Hip IR     ?Knee Extension    ?Knee Flexion    ?Dorsiflexion     ?Plantarflexion (seated)    ? ?ABDOMINAL: deferred 2/2 to time constraints ?Palpation: ?Diastasis: ?Scar mobility: ?Rib flare: ? ?SPECIAL TESTS: deferred 2/2 to time constraints ? ?PHYSICAL PERFORMANCE MEASURES: ?STS: WNL ?Deep Squat: ?RLE STS: ?LLE STS:  ?6MWT: ?5TSTS:   ? ?EXTERNAL PELVIC EXAM: deferred 2/2 to time constraints ?Breath coordination: ?Voluntary Contraction: present/absent ?Relaxation: full/delayed/non-relaxing ?Perineal movement with sustained IAP increase ("bear down"): descent/no change/elevation/excessive descent ?Perineal movement with rapid IAP increase  ("cough"): elevation/no change/descent ? ?INTERNAL VAGINAL EXAM: deferred 2/2 to time constraints ?Introitus Appears:  ?Skin integrity:  ?Scar mobility: ?Strength (PERF):  ?Symmetry: ?Palpation: ?Prolapse: ? ?I

## 2021-11-10 ENCOUNTER — Ambulatory Visit: Payer: Managed Care, Other (non HMO) | Admitting: Physical Therapy

## 2021-11-10 ENCOUNTER — Encounter: Payer: Self-pay | Admitting: Physical Therapy

## 2021-11-10 DIAGNOSIS — R102 Pelvic and perineal pain: Secondary | ICD-10-CM

## 2021-11-10 DIAGNOSIS — R278 Other lack of coordination: Secondary | ICD-10-CM

## 2021-11-10 DIAGNOSIS — M62838 Other muscle spasm: Secondary | ICD-10-CM

## 2021-11-10 NOTE — Therapy (Signed)
?OUTPATIENT PHYSICAL THERAPY TREATMENT NOTE ? ? ?Patient Name: Julia Nunez ?MRN: [id number] ?DOB:02/21/1971, 51 y.o., female ?Today's Date: 11/10/2021 ? ?PCP: Sofie Hartigan, MD ?REFERRING PROVIDER: Ardis Hughs, MD ? ?END OF SESSION:  ? PT End of Session - 11/10/21 1331   ? ? Visit Number 2   ? Number of Visits 8   ? Date for PT Re-Evaluation 12/30/21   ? PT Start Time 1330   ? PT Stop Time 1410   ? PT Time Calculation (min) 40 min   ? Activity Tolerance Patient tolerated treatment well   ? Behavior During Therapy [facility] for tasks assessed/performed   ? ?  ?  ? ?  ? ? ?Past Medical History:  ?Diagnosis Date  ? Anxiety   ? Back pain   ? Complication of anesthesia   ? Diabetes mellitus without complication ([place])   ? Family history of adverse reaction to anesthesia   ? DAD-NAUSEATED   ? GERD (gastroesophageal reflux disease)   ? Barrett's esophagus  ? Headache   ? H/O MIGRAINES  ? Hypertension   ? PONV (postoperative nausea and vomiting)   ? NAUSEATED  ? Sleep apnea   ? CPAP  ? Wears contact lenses   ? ?Past Surgical History:  ?Procedure Laterality Date  ? CARDIAC CATHETERIZATION    ? no stents  ? [place]  ? X1  ? COLONOSCOPY WITH PROPOFOL N/A 03/27/2015  ? Procedure: COLONOSCOPY WITH PROPOFOL;  Surgeon: Manya Silvas, MD;  Location: [facility] ENDOSCOPY;  Service: Endoscopy;  Laterality: N/A;  ? COLONOSCOPY WITH PROPOFOL N/A 03/10/2020  ? Procedure: COLONOSCOPY WITH PROPOFOL;  Surgeon: [name], MD;  Location: [facility] ENDOSCOPY;  Service: Endoscopy;  Laterality: N/A;  ? CYSTECTOMY    ? SEBACEOUS CYST ON SCALP  ? CYSTOSCOPY    ? ESOPHAGOGASTRODUODENOSCOPY N/A 03/27/2015  ? Procedure: ESOPHAGOGASTRODUODENOSCOPY (EGD);  Surgeon: Manya Silvas, MD;  Location: [facility] ENDOSCOPY;  Service: Endoscopy;  Laterality: N/A;  ? ESOPHAGOGASTRODUODENOSCOPY N/A 03/10/2020  ? Procedure: ESOPHAGOGASTRODUODENOSCOPY (EGD);  Surgeon: [name], MD;  Location: [facility] ENDOSCOPY;  Service: Endoscopy;   Laterality: N/A;  ? LAPAROSCOPIC BILATERAL SALPINGECTOMY Bilateral 05/24/2016  ? Procedure: LAPAROSCOPIC BILATERAL TUBAL BANDING VIA FALOPE RINGS;  Surgeon: Brayton Mars, MD;  Location: [facility] ORS;  Service: Gynecology;  Laterality: Bilateral;  ? SAVORY DILATION N/A 03/27/2015  ? Procedure: SAVORY DILATION;  Surgeon: Manya Silvas, MD;  Location: [facility] ENDOSCOPY;  Service: Endoscopy;  Laterality: N/A;  ? TUBAL LIGATION    ? ?Patient Active Problem List  ? Diagnosis Date Noted  ? Status post tubal ligation 06/02/2016  ? Anxiety 12/12/2014  ? Borderline diabetes 12/12/2014  ? Adiposity 12/12/2014  ? Avitaminosis D 12/12/2014  ? ? ?REFERRING DIAG: R10.2 (ICD-10-CM) - Pelvic and perineal pain ?R39.82 (ICD-10-CM) - Chronic bladder pain ?R30.0 (ICD-10-CM) - Dysuria ? ?THERAPY DIAG:  ?Pelvic pain ? ?Other lack of coordination ? ?Other muscle spasm ? ?PERTINENT HISTORY: Scoliosis Negative. ?Pulmonary disease/dysfunction Negative. ?Surgical history: Positive for c-section, B saplingectomy, tubal ligation. ? ?PRECAUTIONS: None ? ?SUBJECTIVE: Patient denies any significant changes since initial evaluation. Patient has been trying to remember to uncross legs in sitting postures.  ? ?PAIN:  ?Are you having pain? Yes: NPRS scale: 2/10 ?Pain location: LLQ ?Pain description: dull ache ? ?TREATMENT ? ?Pre-treatment assessment: L IC elevated, L ASIS anterior  ?RANGE OF MOTION:  ?  LEFT RIGHT  ?Lumbar forward flexion (65):  WNL    ?Lumbar extension (30):  WNL    ?Lumbar lateral flexion (25):  37.5 cm from lateral malleolus 35.5 cm from lateral malleolus  ?Thoracic and Lumbar rotation (30 degrees):    WNL WNL  ?Hip Flexion (0-125):   WNL WNL  ?Hip IR (0-45):  WNL WNL  ?Hip ER (0-45):  WNL WNL  ?Hip Abduction (0-40):  WNL WNL  ?Hip extension (0-15):     ? ? ?SENSATION: ?Grossly intact to light touch bilateral LEs as determined by testing dermatomes L2-S2 ?Proprioception and hot/cold testing deferred on this date ? ?STRENGTH: MMT  ?  RLE LLE  ?Hip Flexion 5 4  ?Hip Extension 4 4  ?Hip Abduction  5 5  ?Hip Adduction  5 5  ?Hip ER  4 3+  ?Hip IR  4 3+  ?Knee Extension 5 3+  ?Knee Flexion 5 5  ?Dorsiflexion  5 5  ?Plantarflexion (seated) 5 5  ? ?ABDOMINAL:  ?Palpation: no TTP, deep palpation of psoas on L mildly tender but not significantly so ?Diastasis: 5-7.2 finger at umbilicus, WNL superior and inferior ?Rib flare: L > R ? ?SPECIAL TESTS: ?SLR (SN 92, -LR 0.29): R: Negative L:  Negative ? ?FABER (SN 81): R: Negative L: Negative ?FADIR (SN 94): R: Negative L: Negative ? ?Manual Therapy: ? ? ?Neuromuscular Re-education: ?Supine knee to chest with PFM lengthening, BLE, for improved PFM spasm release ?Supine double knee to chest with PFM lengthening for improved PFM spasm release ?Supine butterfly with PFM lengthening, BLE, for improved PFM tissue length  ? ?Therapeutic Exercise: ? ? ?Treatments unbilled: ? ?Post-treatment assessment: ? ?Patient educated throughout session on appropriate technique and form using multi-modal cueing, HEP, and activity modification. Patient articulated understanding and returned demonstration. ? ?Patient Response to interventions: ?No increased pain ? ? ? ?PATIENT SURVEYS:  ?IE: FOTO Bowel Constipation 68; Urinary Problem 63; PFDI Pain 8 ? ?ASSESSMENT: ? ?Clinical impression: ?Patient presents to clinic with excellent motivation to participate in therapy. Patient demonstrates deficits in PFM coordination, PFM strength, IAP management, posture, and pain. Patient able to achieve coordinated breath and release of PFM and B adductor tension during today's session and responded positively to active interventions. Patient will benefit from continued skilled therapeutic intervention to address remaining deficits in PFM coordination, PFM strength, IAP management, posture, and pain in order to increase function and improve overall QOL. ? ? ?Objective impairments: decreased coordination, decreased endurance, decreased strength,  impaired flexibility, improper body mechanics, postural dysfunction, and pain.  ? ?Activity limitations: community activity, meal prep, occupation, and yard work.  ? ?Personal factors: Behavior pattern, Past/current experiences, and Time since onset of injury/illness/exacerbation and comorbidities 3+: anxiety, DDD, DM, GERD, hyperlipidemia, HTN, OSA, RLS are also affecting patient's functional outcome.  ? ?Rehab Potential: Good ? ?Clinical decision making: Evolving/moderate complexity ? ?Evaluation complexity: Moderate ? ? ?GOALS: ?Goals reviewed with patient? Yes ? ?LONG TERM GOALS: Target date: 12/30/2021 ? ?Patient will demonstrate improved function as evidenced by a score of 73 on FOTO Bowel Constipation measure for full participation in activities at home and in the community.  ?Baseline: 68 ?Goal status: INITIAL ? ?2.  Patient will demonstrate improved function as evidenced by a score of 74 on FOTO Urinary Problem measure for full participation in activities at home and in the community. ?Baseline: 63 ?Goal status: INITIAL ? ?3.  Patient will demonstrate improved function as evidenced by a score of 0 on PFDI Pain measure for full participation in activities at home and in the community. ?Baseline: 8 ?Goal status: INITIAL ? ?  4.  Patient will demonstrate understanding of basic self-management/down-regulation of the nervous system for persistent pain condition and stress as evidenced by diaphragmatic breathing without cueing, body scan/progressive relaxation meditation, and improved sleep hygiene including in order to transition to independent management of patient's chief complaint:dyspareunia and dysuria. ?Baseline: not demonstrated ?Goal status: INITIAL ? ? ? ?PLAN: ?Rehab frequency: 1x/week ? ?Rehab duration: 8 weeks ? ?Planned interventions: Therapeutic exercises, Therapeutic activity, Neuromuscular re-education, Balance training, Gait training, Patient/Family education, Joint mobilization, Orthotic/Fit  training, Electrical stimulation, Spinal manipulation, Spinal mobilization, Cryotherapy, Moist heat, scar mobilization, Taping, Biofeedback, and Manual therapy ? ? ?Myles Gip PT, DPT [phone]  ?11/10/2021, 1:32

## 2021-11-11 ENCOUNTER — Ambulatory Visit: Payer: Managed Care, Other (non HMO) | Admitting: Physical Therapy

## 2021-11-18 ENCOUNTER — Encounter: Payer: Self-pay | Admitting: Physical Therapy

## 2021-11-18 ENCOUNTER — Ambulatory Visit: Payer: Managed Care, Other (non HMO) | Admitting: Physical Therapy

## 2021-11-18 DIAGNOSIS — R102 Pelvic and perineal pain: Secondary | ICD-10-CM

## 2021-11-18 DIAGNOSIS — M62838 Other muscle spasm: Secondary | ICD-10-CM

## 2021-11-18 DIAGNOSIS — R278 Other lack of coordination: Secondary | ICD-10-CM

## 2021-11-18 NOTE — Therapy (Signed)
?OUTPATIENT PHYSICAL THERAPY TREATMENT NOTE ? ? ?Patient Name: Julia Nunez ?MRN: [id number] ?DOB:12-18-1970, 51 y.o., female ?Today's Date: 11/18/2021 ? ?PCP: Sofie Hartigan, MD ?REFERRING PROVIDER: Ardis Hughs, MD ? ?END OF SESSION:  ? PT End of Session - 11/18/21 1737   ? ? Visit Number 3   ? Number of Visits 8   ? Date for PT Re-Evaluation 12/30/21   ? PT Start Time 0017   ? PT Stop Time 4944   ? PT Time Calculation (min) 40 min   ? Activity Tolerance Patient tolerated treatment well   ? Behavior During Therapy [facility] for tasks assessed/performed   ? ?  ?  ? ?  ? ? ?Past Medical History:  ?Diagnosis Date  ? Anxiety   ? Back pain   ? Complication of anesthesia   ? Diabetes mellitus without complication ([place])   ? Family history of adverse reaction to anesthesia   ? DAD-NAUSEATED   ? GERD (gastroesophageal reflux disease)   ? Barrett's esophagus  ? Headache   ? H/O MIGRAINES  ? Hypertension   ? PONV (postoperative nausea and vomiting)   ? NAUSEATED  ? Sleep apnea   ? CPAP  ? Wears contact lenses   ? ?Past Surgical History:  ?Procedure Laterality Date  ? CARDIAC CATHETERIZATION    ? no stents  ? [place]  ? X1  ? COLONOSCOPY WITH PROPOFOL N/A 03/27/2015  ? Procedure: COLONOSCOPY WITH PROPOFOL;  Surgeon: Manya Silvas, MD;  Location: [facility] ENDOSCOPY;  Service: Endoscopy;  Laterality: N/A;  ? COLONOSCOPY WITH PROPOFOL N/A 03/10/2020  ? Procedure: COLONOSCOPY WITH PROPOFOL;  Surgeon: [name], MD;  Location: [facility] ENDOSCOPY;  Service: Endoscopy;  Laterality: N/A;  ? CYSTECTOMY    ? SEBACEOUS CYST ON SCALP  ? CYSTOSCOPY    ? ESOPHAGOGASTRODUODENOSCOPY N/A 03/27/2015  ? Procedure: ESOPHAGOGASTRODUODENOSCOPY (EGD);  Surgeon: Manya Silvas, MD;  Location: [facility] ENDOSCOPY;  Service: Endoscopy;  Laterality: N/A;  ? ESOPHAGOGASTRODUODENOSCOPY N/A 03/10/2020  ? Procedure: ESOPHAGOGASTRODUODENOSCOPY (EGD);  Surgeon: [name], MD;  Location: [facility] ENDOSCOPY;  Service: Endoscopy;   Laterality: N/A;  ? LAPAROSCOPIC BILATERAL SALPINGECTOMY Bilateral 05/24/2016  ? Procedure: LAPAROSCOPIC BILATERAL TUBAL BANDING VIA FALOPE RINGS;  Surgeon: Brayton Mars, MD;  Location: [facility] ORS;  Service: Gynecology;  Laterality: Bilateral;  ? SAVORY DILATION N/A 03/27/2015  ? Procedure: SAVORY DILATION;  Surgeon: Manya Silvas, MD;  Location: [facility] ENDOSCOPY;  Service: Endoscopy;  Laterality: N/A;  ? TUBAL LIGATION    ? ?Patient Active Problem List  ? Diagnosis Date Noted  ? Status post tubal ligation 06/02/2016  ? Anxiety 12/12/2014  ? Borderline diabetes 12/12/2014  ? Adiposity 12/12/2014  ? Avitaminosis D 12/12/2014  ? ? ?REFERRING DIAG: R10.2 (ICD-10-CM) - Pelvic and perineal pain ?R39.82 (ICD-10-CM) - Chronic bladder pain ?R30.0 (ICD-10-CM) - Dysuria ? ?THERAPY DIAG:  ?Pelvic pain ? ?Other lack of coordination ? ?Other muscle spasm ? ?PERTINENT HISTORY: Scoliosis Negative. ?Pulmonary disease/dysfunction Negative. ?Surgical history: Positive for c-section, B saplingectomy, tubal ligation. ? ?PRECAUTIONS: None ? ?SUBJECTIVE: Patient notes some increased stress with upcoming trip. Patient has been doing exercises every night and has tried some Pilates exercises with good effect and some modifications. Patient is back on vaginal valium at '5mg'$ . Patient does note some burning sensations today with urination (3/10). Patient reports that today she started to have some L ovarian pain.  ? ?PAIN:  ?Are you having pain? Yes: NPRS scale: 2/10 ?Pain location: LLQ ?  Pain description: dull ache ? ?TREATMENT ? ?Pre-treatment assessment:  ? ?Manual Therapy: ? ? ?Neuromuscular Re-education: ?Patient performed core stabilization/postural control interventions as listed:  ?- Hooklying Transversus Abdominis Palpation  - 10 reps ?- Supine Transversus Abdominis Bracing with Double Leg Fallout  - 10 reps ?- Hooklying Small March  - 10 reps ?- Supine Transversus Abdominis Bracing with Heel Slide  - 10 reps ?- Dead Bug  - 10  reps ? ?Therapeutic Exercise: ? ? ?Treatments unbilled: ? ?Post-treatment assessment: ? ?Patient educated throughout session on appropriate technique and form using multi-modal cueing, HEP, and activity modification. Patient articulated understanding and returned demonstration. ? ?Patient Response to interventions: ?No increased pain ? ? ? ?PATIENT SURVEYS:  ?IE: FOTO Bowel Constipation 68; Urinary Problem 63; PFDI Pain 8 ? ?ASSESSMENT: ? ?Clinical impression: ?Patient presents to clinic with excellent motivation to participate in therapy. Patient demonstrates deficits in PFM coordination, PFM strength, IAP management, posture, and pain. Patient able to perform supine core stabilization activities during today's session and responded positively to verbal and tactile cueing to ensure controlled movement without compensation. Patient will benefit from continued skilled therapeutic intervention to address remaining deficits in PFM coordination, PFM strength, IAP management, posture, and pain in order to increase function and improve overall QOL. ? ? ?Objective impairments: decreased coordination, decreased endurance, decreased strength, impaired flexibility, improper body mechanics, postural dysfunction, and pain.  ? ?Activity limitations: community activity, meal prep, occupation, and yard work.  ? ?Personal factors: Behavior pattern, Past/current experiences, and Time since onset of injury/illness/exacerbation and comorbidities 3+: anxiety, DDD, DM, GERD, hyperlipidemia, HTN, OSA, RLS are also affecting patient's functional outcome.  ? ?Rehab Potential: Good ? ?Clinical decision making: Evolving/moderate complexity ? ?Evaluation complexity: Moderate ? ? ?GOALS: ?Goals reviewed with patient? Yes ? ?LONG TERM GOALS: Target date: 12/30/2021 ? ?Patient will demonstrate improved function as evidenced by a score of 73 on FOTO Bowel Constipation measure for full participation in activities at home and in the community.   ?Baseline: 68 ?Goal status: INITIAL ? ?2.  Patient will demonstrate improved function as evidenced by a score of 74 on FOTO Urinary Problem measure for full participation in activities at home and in the community. ?Baseline: 63 ?Goal status: INITIAL ? ?3.  Patient will demonstrate improved function as evidenced by a score of 0 on PFDI Pain measure for full participation in activities at home and in the community. ?Baseline: 8 ?Goal status: INITIAL ? ?4.  Patient will demonstrate understanding of basic self-management/down-regulation of the nervous system for persistent pain condition and stress as evidenced by diaphragmatic breathing without cueing, body scan/progressive relaxation meditation, and improved sleep hygiene including in order to transition to independent management of patient's chief complaint:dyspareunia and dysuria. ?Baseline: not demonstrated ?Goal status: INITIAL ? ? ? ?PLAN: ?Rehab frequency: 1x/week ? ?Rehab duration: 8 weeks ? ?Planned interventions: Therapeutic exercises, Therapeutic activity, Neuromuscular re-education, Balance training, Gait training, Patient/Family education, Joint mobilization, Orthotic/Fit training, Electrical stimulation, Spinal manipulation, Spinal mobilization, Cryotherapy, Moist heat, scar mobilization, Taping, Biofeedback, and Manual therapy ? ? Myles Gip PT, DPT [phone]  ?11/18/2021, 5:55 PM ? ?  ? ?

## 2021-11-19 ENCOUNTER — Encounter: Payer: Managed Care, Other (non HMO) | Admitting: Physical Therapy

## 2021-11-24 ENCOUNTER — Encounter: Payer: Managed Care, Other (non HMO) | Admitting: Physical Therapy

## 2021-11-25 ENCOUNTER — Encounter: Payer: Self-pay | Admitting: Physical Therapy

## 2021-11-25 ENCOUNTER — Ambulatory Visit: Payer: Managed Care, Other (non HMO) | Attending: Urology | Admitting: Physical Therapy

## 2021-11-25 DIAGNOSIS — M62838 Other muscle spasm: Secondary | ICD-10-CM | POA: Diagnosis present

## 2021-11-25 DIAGNOSIS — R278 Other lack of coordination: Secondary | ICD-10-CM | POA: Diagnosis present

## 2021-11-25 DIAGNOSIS — R102 Pelvic and perineal pain: Secondary | ICD-10-CM | POA: Insufficient documentation

## 2021-11-25 NOTE — Therapy (Signed)
?OUTPATIENT PHYSICAL THERAPY TREATMENT NOTE ? ? ?Patient Name: Julia Nunez ?MRN: [id number] ?DOB:08-25-1970, 51 y.o., female ?Today's Date: 11/25/2021 ? ?PCP: Sofie Hartigan, MD ?REFERRING PROVIDER: Ardis Hughs, MD ? ?END OF SESSION:  ? PT End of Session - 11/25/21 1807   ? ? Visit Number 4   ? Number of Visits 8   ? Date for PT Re-Evaluation 12/30/21   ? PT Start Time 1805   ? PT Stop Time 0177   ? PT Time Calculation (min) 40 min   ? Activity Tolerance Patient tolerated treatment well   ? Behavior During Therapy [facility] for tasks assessed/performed   ? ?  ?  ? ?  ? ? ?Past Medical History:  ?Diagnosis Date  ? Anxiety   ? Back pain   ? Complication of anesthesia   ? Diabetes mellitus without complication ([place])   ? Family history of adverse reaction to anesthesia   ? DAD-NAUSEATED   ? GERD (gastroesophageal reflux disease)   ? Barrett's esophagus  ? Headache   ? H/O MIGRAINES  ? Hypertension   ? PONV (postoperative nausea and vomiting)   ? NAUSEATED  ? Sleep apnea   ? CPAP  ? Wears contact lenses   ? ?Past Surgical History:  ?Procedure Laterality Date  ? CARDIAC CATHETERIZATION    ? no stents  ? [place]  ? X1  ? COLONOSCOPY WITH PROPOFOL N/A 03/27/2015  ? Procedure: COLONOSCOPY WITH PROPOFOL;  Surgeon: Manya Silvas, MD;  Location: [facility] ENDOSCOPY;  Service: Endoscopy;  Laterality: N/A;  ? COLONOSCOPY WITH PROPOFOL N/A 03/10/2020  ? Procedure: COLONOSCOPY WITH PROPOFOL;  Surgeon: [name], MD;  Location: [facility] ENDOSCOPY;  Service: Endoscopy;  Laterality: N/A;  ? CYSTECTOMY    ? SEBACEOUS CYST ON SCALP  ? CYSTOSCOPY    ? ESOPHAGOGASTRODUODENOSCOPY N/A 03/27/2015  ? Procedure: ESOPHAGOGASTRODUODENOSCOPY (EGD);  Surgeon: Manya Silvas, MD;  Location: [facility] ENDOSCOPY;  Service: Endoscopy;  Laterality: N/A;  ? ESOPHAGOGASTRODUODENOSCOPY N/A 03/10/2020  ? Procedure: ESOPHAGOGASTRODUODENOSCOPY (EGD);  Surgeon: [name], MD;  Location: [facility] ENDOSCOPY;  Service: Endoscopy;   Laterality: N/A;  ? LAPAROSCOPIC BILATERAL SALPINGECTOMY Bilateral 05/24/2016  ? Procedure: LAPAROSCOPIC BILATERAL TUBAL BANDING VIA FALOPE RINGS;  Surgeon: Brayton Mars, MD;  Location: [facility] ORS;  Service: Gynecology;  Laterality: Bilateral;  ? SAVORY DILATION N/A 03/27/2015  ? Procedure: SAVORY DILATION;  Surgeon: Manya Silvas, MD;  Location: [facility] ENDOSCOPY;  Service: Endoscopy;  Laterality: N/A;  ? TUBAL LIGATION    ? ?Patient Active Problem List  ? Diagnosis Date Noted  ? Status post tubal ligation 06/02/2016  ? Anxiety 12/12/2014  ? Borderline diabetes 12/12/2014  ? Adiposity 12/12/2014  ? Avitaminosis D 12/12/2014  ? ? ?REFERRING DIAG: R10.2 (ICD-10-CM) - Pelvic and perineal pain ?R39.82 (ICD-10-CM) - Chronic bladder pain ?R30.0 (ICD-10-CM) - Dysuria ? ?THERAPY DIAG:  ?Pelvic pain ? ?Other lack of coordination ? ?Other muscle spasm ? ?PERTINENT HISTORY: Scoliosis Negative. ?Pulmonary disease/dysfunction Negative. ?Surgical history: Positive for c-section, B saplingectomy, tubal ligation. ? ?PRECAUTIONS: None ? ?SUBJECTIVE: Patient reports a good trip with minimal pelvic symptoms. Patient has had some mild burning since returning. Patient is only feeling the burning with urination. ? ?PAIN:  ?Are you having pain? Yes: NPRS scale: 0-2/10 ?Pain location: LLQ ?Pain description: dull ache ? ?TREATMENT ? ?Pre-treatment assessment:  ? ?Manual Therapy: ? ? ?Neuromuscular Re-education: ?Patient performed core stabilization/postural control interventions as listed:  ?- Cat-Camel  - 10 reps ?-  Quadruped Transversus Abdominis Bracing  - 10 reps ?- Quadruped Alternating Arm Lift  - 10 reps ?- Quadruped Bent Leg Hip Extension  - 10 reps ?- Quadruped Hip Abduction and External Rotation  - 10 reps ? ?Therapeutic Exercise: ? ? ?Treatments unbilled: ? ?Post-treatment assessment: ? ?Patient educated throughout session on appropriate technique and form using multi-modal cueing, HEP, and activity modification. Patient  articulated understanding and returned demonstration. ? ?Patient Response to interventions: ?No increased pain ? ? ? ?PATIENT SURVEYS:  ?IE: FOTO Bowel Constipation 68; Urinary Problem 63; PFDI Pain 8 ? ?ASSESSMENT: ? ?Clinical impression: ?Patient presents to clinic with excellent motivation to participate in therapy. Patient demonstrates deficits in PFM coordination, PFM strength, IAP management, posture, and pain. Patient able to perform quadruped core stabilization activities during today's session and responded positively to verbal and tactile cueing to ensure controlled movement without compensation. Patient will benefit from continued skilled therapeutic intervention to address remaining deficits in PFM coordination, PFM strength, IAP management, posture, and pain in order to increase function and improve overall QOL. ? ? ?Objective impairments: decreased coordination, decreased endurance, decreased strength, impaired flexibility, improper body mechanics, postural dysfunction, and pain.  ? ?Activity limitations: community activity, meal prep, occupation, and yard work.  ? ?Personal factors: Behavior pattern, Past/current experiences, and Time since onset of injury/illness/exacerbation and comorbidities 3+: anxiety, DDD, DM, GERD, hyperlipidemia, HTN, OSA, RLS are also affecting patient's functional outcome.  ? ?Rehab Potential: Good ? ?Clinical decision making: Evolving/moderate complexity ? ?Evaluation complexity: Moderate ? ? ?GOALS: ?Goals reviewed with patient? Yes ? ?LONG TERM GOALS: Target date: 12/30/2021 ? ?Patient will demonstrate improved function as evidenced by a score of 73 on FOTO Bowel Constipation measure for full participation in activities at home and in the community.  ?Baseline: 68 ?Goal status: INITIAL ? ?2.  Patient will demonstrate improved function as evidenced by a score of 74 on FOTO Urinary Problem measure for full participation in activities at home and in the community. ?Baseline:  63 ?Goal status: INITIAL ? ?3.  Patient will demonstrate improved function as evidenced by a score of 0 on PFDI Pain measure for full participation in activities at home and in the community. ?Baseline: 8 ?Goal status: INITIAL ? ?4.  Patient will demonstrate understanding of basic self-management/down-regulation of the nervous system for persistent pain condition and stress as evidenced by diaphragmatic breathing without cueing, body scan/progressive relaxation meditation, and improved sleep hygiene including in order to transition to independent management of patient's chief complaint:dyspareunia and dysuria. ?Baseline: not demonstrated ?Goal status: INITIAL ? ? ? ?PLAN: ?Rehab frequency: 1x/week ? ?Rehab duration: 8 weeks ? ?Planned interventions: Therapeutic exercises, Therapeutic activity, Neuromuscular re-education, Balance training, Gait training, Patient/Family education, Joint mobilization, Orthotic/Fit training, Electrical stimulation, Spinal manipulation, Spinal mobilization, Cryotherapy, Moist heat, scar mobilization, Taping, Biofeedback, and Manual therapy ? ? Myles Gip PT, DPT [phone]  ?11/25/2021, 6:08 PM ? ?  ? ?

## 2021-12-01 ENCOUNTER — Encounter: Payer: Managed Care, Other (non HMO) | Admitting: Physical Therapy

## 2021-12-02 ENCOUNTER — Ambulatory Visit: Payer: Managed Care, Other (non HMO) | Admitting: Physical Therapy

## 2021-12-02 ENCOUNTER — Encounter: Payer: Self-pay | Admitting: Physical Therapy

## 2021-12-02 DIAGNOSIS — R102 Pelvic and perineal pain: Secondary | ICD-10-CM

## 2021-12-02 DIAGNOSIS — M62838 Other muscle spasm: Secondary | ICD-10-CM

## 2021-12-02 DIAGNOSIS — R278 Other lack of coordination: Secondary | ICD-10-CM

## 2021-12-02 NOTE — Therapy (Signed)
?OUTPATIENT PHYSICAL THERAPY TREATMENT NOTE ? ? ?Patient Name: Julia Nunez ?MRN: [id number] ?DOB:08/29/1970, 50 y.o., female ?Today's Date: 12/02/2021 ? ?PCP: Sofie Hartigan, MD ?REFERRING PROVIDER: Ardis Hughs, MD ? ?END OF SESSION:  ? PT End of Session - 12/02/21 1722   ? ? Visit Number 5   ? Number of Visits 8   ? Date for PT Re-Evaluation 12/30/21   ? PT Start Time 4098   ? PT Stop Time 1800   ? PT Time Calculation (min) 40 min   ? Activity Tolerance Patient tolerated treatment well   ? Behavior During Therapy [facility] for tasks assessed/performed   ? ?  ?  ? ?  ? ? ?Past Medical History:  ?Diagnosis Date  ? Anxiety   ? Back pain   ? Complication of anesthesia   ? Diabetes mellitus without complication ([place])   ? Family history of adverse reaction to anesthesia   ? DAD-NAUSEATED   ? GERD (gastroesophageal reflux disease)   ? Barrett's esophagus  ? Headache   ? H/O MIGRAINES  ? Hypertension   ? PONV (postoperative nausea and vomiting)   ? NAUSEATED  ? Sleep apnea   ? CPAP  ? Wears contact lenses   ? ?Past Surgical History:  ?Procedure Laterality Date  ? CARDIAC CATHETERIZATION    ? no stents  ? [place]  ? X1  ? COLONOSCOPY WITH PROPOFOL N/A 03/27/2015  ? Procedure: COLONOSCOPY WITH PROPOFOL;  Surgeon: Manya Silvas, MD;  Location: [facility] ENDOSCOPY;  Service: Endoscopy;  Laterality: N/A;  ? COLONOSCOPY WITH PROPOFOL N/A 03/10/2020  ? Procedure: COLONOSCOPY WITH PROPOFOL;  Surgeon: [name], MD;  Location: [facility] ENDOSCOPY;  Service: Endoscopy;  Laterality: N/A;  ? CYSTECTOMY    ? SEBACEOUS CYST ON SCALP  ? CYSTOSCOPY    ? ESOPHAGOGASTRODUODENOSCOPY N/A 03/27/2015  ? Procedure: ESOPHAGOGASTRODUODENOSCOPY (EGD);  Surgeon: Manya Silvas, MD;  Location: [facility] ENDOSCOPY;  Service: Endoscopy;  Laterality: N/A;  ? ESOPHAGOGASTRODUODENOSCOPY N/A 03/10/2020  ? Procedure: ESOPHAGOGASTRODUODENOSCOPY (EGD);  Surgeon: [name], MD;  Location: [facility] ENDOSCOPY;  Service: Endoscopy;   Laterality: N/A;  ? LAPAROSCOPIC BILATERAL SALPINGECTOMY Bilateral 05/24/2016  ? Procedure: LAPAROSCOPIC BILATERAL TUBAL BANDING VIA FALOPE RINGS;  Surgeon: Brayton Mars, MD;  Location: [facility] ORS;  Service: Gynecology;  Laterality: Bilateral;  ? SAVORY DILATION N/A 03/27/2015  ? Procedure: SAVORY DILATION;  Surgeon: Manya Silvas, MD;  Location: [facility] ENDOSCOPY;  Service: Endoscopy;  Laterality: N/A;  ? TUBAL LIGATION    ? ?Patient Active Problem List  ? Diagnosis Date Noted  ? Status post tubal ligation 06/02/2016  ? Anxiety 12/12/2014  ? Borderline diabetes 12/12/2014  ? Adiposity 12/12/2014  ? Avitaminosis D 12/12/2014  ? ? ?REFERRING DIAG: R10.2 (ICD-10-CM) - Pelvic and perineal pain ?R39.82 (ICD-10-CM) - Chronic bladder pain ?R30.0 (ICD-10-CM) - Dysuria ? ?THERAPY DIAG:  ?Pelvic pain ? ?Other lack of coordination ? ?Other muscle spasm ? ?PERTINENT HISTORY: Scoliosis Negative. ?Pulmonary disease/dysfunction Negative. ?Surgical history: Positive for c-section, B saplingectomy, tubal ligation. ? ?PRECAUTIONS: None ? ?SUBJECTIVE: Patient denies any new changes. Patient reports feeling good. Reports decreased frequency of pain. Patient also reports no pain with urination.  ? ?PAIN:  ?Are you having pain? Yes: NPRS scale: 0/10 ?Pain location:   ?Pain description:   ? ?TREATMENT ? ?Pre-treatment assessment:  ?EXTERNAL PELVIC EXAM: Patient educated on the purpose of the pelvic exam and articulated understanding; patient consented to the exam verbally.  ?Breath coordination: present,  inconsistently and minimallu ?Voluntary Contraction: present, weak squeeze ?Relaxation: full ?Perineal movement with sustained IAP increase ("bear down"): elevation ?Perineal movement with rapid IAP increase ("cough"): elevation ? ?Manual Therapy: ? ? ?Neuromuscular Re-education: ?Supine knee to chest with PFM lengthening, BLE, for improved PFM spasm release ?Supine double knee to chest with PFM lengthening for improved PFM spasm  release ?Standing Pilates Postural Control ?Hug a Tree, GTB   ?Serve a Tray, GTB ?Shaving/Salute, GTB ? ? ?Therapeutic Exercise: ? ? ?Treatments unbilled: ? ?Post-treatment assessment: ? ?Patient educated throughout session on appropriate technique and form using multi-modal cueing, HEP, and activity modification. Patient articulated understanding and returned demonstration. ? ?Patient Response to interventions: ?No increased pain ? ? ? ?PATIENT SURVEYS:  ?IE: FOTO Bowel Constipation 68; Urinary Problem 63; PFDI Pain 8 ? ?ASSESSMENT: ? ?Clinical impression: ?Patient presents to clinic with excellent motivation to participate in therapy. Patient demonstrates deficits in PFM coordination, PFM strength, IAP management, posture, and pain. Patient had paradoxical PFM activity with cue to lengthen during today's session and responded positively to PFM stretches with proprioceptive focus. Patient will benefit from continued skilled therapeutic intervention to address remaining deficits in PFM coordination, PFM strength, IAP management, posture, and pain in order to increase function and improve overall QOL. ? ? ?Objective impairments: decreased coordination, decreased endurance, decreased strength, impaired flexibility, improper body mechanics, postural dysfunction, and pain.  ? ?Activity limitations: community activity, meal prep, occupation, and yard work.  ? ?Personal factors: Behavior pattern, Past/current experiences, and Time since onset of injury/illness/exacerbation and comorbidities 3+: anxiety, DDD, DM, GERD, hyperlipidemia, HTN, OSA, RLS are also affecting patient's functional outcome.  ? ?Rehab Potential: Good ? ?Clinical decision making: Evolving/moderate complexity ? ?Evaluation complexity: Moderate ? ? ?GOALS: ?Goals reviewed with patient? Yes ? ?LONG TERM GOALS: Target date: 12/30/2021 ? ?Patient will demonstrate improved function as evidenced by a score of 73 on FOTO Bowel Constipation measure for full  participation in activities at home and in the community.  ?Baseline: 68 ?Goal status: INITIAL ? ?2.  Patient will demonstrate improved function as evidenced by a score of 74 on FOTO Urinary Problem measure for full participation in activities at home and in the community. ?Baseline: 63 ?Goal status: INITIAL ? ?3.  Patient will demonstrate improved function as evidenced by a score of 0 on PFDI Pain measure for full participation in activities at home and in the community. ?Baseline: 8 ?Goal status: INITIAL ? ?4.  Patient will demonstrate understanding of basic self-management/down-regulation of the nervous system for persistent pain condition and stress as evidenced by diaphragmatic breathing without cueing, body scan/progressive relaxation meditation, and improved sleep hygiene including in order to transition to independent management of patient's chief complaint:dyspareunia and dysuria. ?Baseline: not demonstrated ?Goal status: INITIAL ? ? ? ?PLAN: ?Rehab frequency: 1x/week ? ?Rehab duration: 8 weeks ? ?Planned interventions: Therapeutic exercises, Therapeutic activity, Neuromuscular re-education, Balance training, Gait training, Patient/Family education, Joint mobilization, Orthotic/Fit training, Electrical stimulation, Spinal manipulation, Spinal mobilization, Cryotherapy, Moist heat, scar mobilization, Taping, Biofeedback, and Manual therapy ? ? Myles Gip PT, DPT [phone]  ?12/02/2021, 5:23 PM ? ?  ? ?

## 2021-12-09 ENCOUNTER — Encounter: Payer: Self-pay | Admitting: Physical Therapy

## 2021-12-09 ENCOUNTER — Ambulatory Visit: Payer: Managed Care, Other (non HMO) | Admitting: Physical Therapy

## 2021-12-09 DIAGNOSIS — R102 Pelvic and perineal pain unspecified side: Secondary | ICD-10-CM

## 2021-12-09 DIAGNOSIS — M62838 Other muscle spasm: Secondary | ICD-10-CM

## 2021-12-09 DIAGNOSIS — R278 Other lack of coordination: Secondary | ICD-10-CM

## 2021-12-09 NOTE — Therapy (Signed)
?OUTPATIENT PHYSICAL THERAPY TREATMENT NOTE ? ? ?Patient Name: Julia Nunez ?MRN: [id number] ?DOB:12-24-1970, 51 y.o., female ?Today's Date: 12/09/2021 ? ?PCP: Sofie Hartigan, MD ?REFERRING PROVIDER: Ardis Hughs, MD ? ?END OF SESSION:  ? PT End of Session - 12/09/21 1722   ? ? Visit Number 6   ? Number of Visits 8   ? Date for PT Re-Evaluation 12/30/21   ? PT Start Time 6301   ? PT Stop Time 1800   ? PT Time Calculation (min) 40 min   ? Activity Tolerance Patient tolerated treatment well   ? Behavior During Therapy [facility] for tasks assessed/performed   ? ?  ?  ? ?  ? ? ?Past Medical History:  ?Diagnosis Date  ? Anxiety   ? Back pain   ? Complication of anesthesia   ? Diabetes mellitus without complication ([place])   ? Family history of adverse reaction to anesthesia   ? DAD-NAUSEATED   ? GERD (gastroesophageal reflux disease)   ? Barrett's esophagus  ? Headache   ? H/O MIGRAINES  ? Hypertension   ? PONV (postoperative nausea and vomiting)   ? NAUSEATED  ? Sleep apnea   ? CPAP  ? Wears contact lenses   ? ?Past Surgical History:  ?Procedure Laterality Date  ? CARDIAC CATHETERIZATION    ? no stents  ? [place]  ? X1  ? COLONOSCOPY WITH PROPOFOL N/A 03/27/2015  ? Procedure: COLONOSCOPY WITH PROPOFOL;  Surgeon: Manya Silvas, MD;  Location: [facility] ENDOSCOPY;  Service: Endoscopy;  Laterality: N/A;  ? COLONOSCOPY WITH PROPOFOL N/A 03/10/2020  ? Procedure: COLONOSCOPY WITH PROPOFOL;  Surgeon: [name], MD;  Location: [facility] ENDOSCOPY;  Service: Endoscopy;  Laterality: N/A;  ? CYSTECTOMY    ? SEBACEOUS CYST ON SCALP  ? CYSTOSCOPY    ? ESOPHAGOGASTRODUODENOSCOPY N/A 03/27/2015  ? Procedure: ESOPHAGOGASTRODUODENOSCOPY (EGD);  Surgeon: Manya Silvas, MD;  Location: [facility] ENDOSCOPY;  Service: Endoscopy;  Laterality: N/A;  ? ESOPHAGOGASTRODUODENOSCOPY N/A 03/10/2020  ? Procedure: ESOPHAGOGASTRODUODENOSCOPY (EGD);  Surgeon: [name], MD;  Location: [facility] ENDOSCOPY;  Service: Endoscopy;   Laterality: N/A;  ? LAPAROSCOPIC BILATERAL SALPINGECTOMY Bilateral 05/24/2016  ? Procedure: LAPAROSCOPIC BILATERAL TUBAL BANDING VIA FALOPE RINGS;  Surgeon: Brayton Mars, MD;  Location: [facility] ORS;  Service: Gynecology;  Laterality: Bilateral;  ? SAVORY DILATION N/A 03/27/2015  ? Procedure: SAVORY DILATION;  Surgeon: Manya Silvas, MD;  Location: [facility] ENDOSCOPY;  Service: Endoscopy;  Laterality: N/A;  ? TUBAL LIGATION    ? ?Patient Active Problem List  ? Diagnosis Date Noted  ? Status post tubal ligation 06/02/2016  ? Anxiety 12/12/2014  ? Borderline diabetes 12/12/2014  ? Adiposity 12/12/2014  ? Avitaminosis D 12/12/2014  ? ? ?REFERRING DIAG: R10.2 (ICD-10-CM) - Pelvic and perineal pain ?R39.82 (ICD-10-CM) - Chronic bladder pain ?R30.0 (ICD-10-CM) - Dysuria ? ?THERAPY DIAG:  ?Pelvic pain ? ?Other lack of coordination ? ?Other muscle spasm ? ?PERTINENT HISTORY: Scoliosis Negative. ?Pulmonary disease/dysfunction Negative. ?Surgical history: Positive for c-section, B saplingectomy, tubal ligation. ? ?PRECAUTIONS: None ? ?SUBJECTIVE: Patient continues to note improvement. Has been very faithful to HEP and can feel some productive soreness in her abdominals.  ? ?PAIN:  ?Are you having pain? Yes: NPRS scale: 0/10 ?Pain location:   ?Pain description:   ? ?TREATMENT ? ?Pre-treatment assessment:  ? ?Manual Therapy: ? ? ?Neuromuscular Re-education: ?Supported child's pose with hip abduction/ER and diaphragmatic breathing to increase PFM length ?Quadruped rock back to prone press  up with coordinated breath for improved anterior PFM length ?Deep squat with pelvic floor relaxation ?Supine PFM lengthening during exhalation with back pressure an eccentric lengthening of abdominals, reps to fatigue ? ? ?Therapeutic Exercise: ? ? ?Treatments unbilled: ? ?Post-treatment assessment: ? ?Patient educated throughout session on appropriate technique and form using multi-modal cueing, HEP, and activity modification. Patient  articulated understanding and returned demonstration. ? ?Patient Response to interventions: ?No increased pain ? ? ? ?PATIENT SURVEYS:  ?IE: FOTO Bowel Constipation 68; Urinary Problem 63; PFDI Pain 8 ? ?ASSESSMENT: ? ?Clinical impression: ?Patient presents to clinic with excellent motivation to participate in therapy. Patient demonstrates deficits in PFM coordination, PFM strength, IAP management, posture, and pain. Patient able to coordinate perineal descent with exhalation against back pressure during today's session and responded positively to PFM stretches with proprioceptive focus. Patient will benefit from continued skilled therapeutic intervention to address remaining deficits in PFM coordination, PFM strength, IAP management, posture, and pain in order to increase function and improve overall QOL. ? ? ?Objective impairments: decreased coordination, decreased endurance, decreased strength, impaired flexibility, improper body mechanics, postural dysfunction, and pain.  ? ?Activity limitations: community activity, meal prep, occupation, and yard work.  ? ?Personal factors: Behavior pattern, Past/current experiences, and Time since onset of injury/illness/exacerbation and comorbidities 3+: anxiety, DDD, DM, GERD, hyperlipidemia, HTN, OSA, RLS are also affecting patient's functional outcome.  ? ?Rehab Potential: Good ? ?Clinical decision making: Evolving/moderate complexity ? ?Evaluation complexity: Moderate ? ? ?GOALS: ?Goals reviewed with patient? Yes ? ?LONG TERM GOALS: Target date: 12/30/2021 ? ?Patient will demonstrate improved function as evidenced by a score of 73 on FOTO Bowel Constipation measure for full participation in activities at home and in the community.  ?Baseline: 68 ?Goal status: INITIAL ? ?2.  Patient will demonstrate improved function as evidenced by a score of 74 on FOTO Urinary Problem measure for full participation in activities at home and in the community. ?Baseline: 63 ?Goal status:  INITIAL ? ?3.  Patient will demonstrate improved function as evidenced by a score of 0 on PFDI Pain measure for full participation in activities at home and in the community. ?Baseline: 8 ?Goal status: INITIAL ? ?4.  Patient will demonstrate understanding of basic self-management/down-regulation of the nervous system for persistent pain condition and stress as evidenced by diaphragmatic breathing without cueing, body scan/progressive relaxation meditation, and improved sleep hygiene including in order to transition to independent management of patient's chief complaint:dyspareunia and dysuria. ?Baseline: not demonstrated ?Goal status: INITIAL ? ? ? ?PLAN: ?Rehab frequency: 1x/week ? ?Rehab duration: 8 weeks ? ?Planned interventions: Therapeutic exercises, Therapeutic activity, Neuromuscular re-education, Balance training, Gait training, Patient/Family education, Joint mobilization, Orthotic/Fit training, Electrical stimulation, Spinal manipulation, Spinal mobilization, Cryotherapy, Moist heat, scar mobilization, Taping, Biofeedback, and Manual therapy ? ? Myles Gip PT, DPT [phone]  ?12/09/2021, 5:23 PM ? ?  ? ?

## 2021-12-30 ENCOUNTER — Encounter: Payer: Self-pay | Admitting: Physical Therapy

## 2022-01-07 ENCOUNTER — Encounter: Payer: Managed Care, Other (non HMO) | Admitting: Physical Therapy

## 2022-01-11 ENCOUNTER — Ambulatory Visit: Payer: Managed Care, Other (non HMO) | Admitting: Physical Therapy

## 2022-04-14 ENCOUNTER — Other Ambulatory Visit: Payer: Self-pay | Admitting: Obstetrics and Gynecology

## 2022-04-14 DIAGNOSIS — Z1231 Encounter for screening mammogram for malignant neoplasm of breast: Secondary | ICD-10-CM

## 2022-05-06 ENCOUNTER — Ambulatory Visit
Admission: RE | Admit: 2022-05-06 | Discharge: 2022-05-06 | Disposition: A | Discharge status: Home / Self Care | Payer: Managed Care, Other (non HMO) | Source: Ambulatory Visit | Attending: Obstetrics and Gynecology | Admitting: Obstetrics and Gynecology

## 2022-05-06 DIAGNOSIS — Z1231 Encounter for screening mammogram for malignant neoplasm of breast: Secondary | ICD-10-CM | POA: Diagnosis present

## 2022-10-18 ENCOUNTER — Ambulatory Visit: Payer: Managed Care, Other (non HMO) | Admitting: Advanced Practice Midwife

## 2022-10-18 ENCOUNTER — Encounter: Payer: Self-pay | Admitting: Advanced Practice Midwife

## 2022-10-18 DIAGNOSIS — T7411XS Adult physical abuse, confirmed, sequela: Secondary | ICD-10-CM

## 2022-10-18 DIAGNOSIS — T7411XA Adult physical abuse, confirmed, initial encounter: Secondary | ICD-10-CM | POA: Insufficient documentation

## 2022-10-18 DIAGNOSIS — Z113 Encounter for screening for infections with a predominantly sexual mode of transmission: Secondary | ICD-10-CM

## 2022-10-18 DIAGNOSIS — E119 Type 2 diabetes mellitus without complications: Secondary | ICD-10-CM | POA: Insufficient documentation

## 2022-10-18 LAB — WET PREP FOR TRICH, YEAST, CLUE
Trichomonas Exam: NEGATIVE
Yeast Exam: NEGATIVE

## 2022-10-18 LAB — HM HIV SCREENING LAB: HM HIV Screening: NEGATIVE

## 2022-10-18 NOTE — Progress Notes (Signed)
Patient here for STD testing.Ranelle Auker Brewer-Jensen, RN 

## 2022-10-18 NOTE — Progress Notes (Signed)
[facility] Department  STI clinic/screening visit [place] [address] [phone]  Subjective:  Julia Nunez is a 52 y.o.MWF exsmoker G3P2 female being seen today for an STI screening visit. The patient reports they do not have symptoms.  Patient reports that they do not desire a pregnancy in the next year.   They reported they are not interested in discussing contraception today.    Patient's last menstrual period was 09/09/2022 (approximate).  Patient has the following medical conditions:   Patient Active Problem List   Diagnosis Date Noted   Morbid obesity ([place]) 239 lbs 10/18/2022   Diabetes mellitus without complication ([place]) dx'd [id number] 10/18/2022   Status post tubal ligation BTL 2018 06/02/2016   Anxiety 2018 12/12/2014   Borderline diabetes 12/12/2014   Adiposity 12/12/2014   Avitaminosis D 12/12/2014    Chief Complaint  Patient presents with   SEXUALLY TRANSMITTED DISEASE    HPI  Patient reports her husband has HSV and is on episodic tx; they have been very "careful" but 2 wks ago she noticed a "bump" which is now gone; also has increased d/c. Last sex 15 months ago. LMP mid February. Last pap 12/24/15 neg HPV neg. BTL 2018. Diabetic x 4 years on Monjaro. Last cig 11 years ago. Last ETOH 07/2022 (3 [place]) 1x/mo.   Does the patient using douching products? No  Last HIV test per patient/review of record was No results found for: "HMHIVSCREEN" No results found for: "HIV" Patient reports last pap was No results found for: "DIAGPAP" No results found for: "SPECADGYN"  Screening for MPX risk: Does the patient have an unexplained rash? No Is the patient MSM? No Does the patient endorse multiple sex partners or anonymous sex partners? No Did the patient have close or sexual contact with a person diagnosed with MPX? No Has the patient traveled outside the [place] where MPX is endemic? No Is there a high clinical suspicion for MPX--  evidenced by one of the following No  -Unlikely to be chickenpox  -Lymphadenopathy  -Rash that present in same phase of evolution on any given body part See flowsheet for further details and programmatic requirements.   Immunization history:  Immunization History  Administered Date(s) Administered   Praxair 08/07/2019, 09/04/2019     The following portions of the patient's history were reviewed and updated as appropriate: allergies, current medications, past medical history, past social history, past surgical history and problem list.  Objective:  There were no vitals filed for this visit.  Physical Exam Vitals and nursing note reviewed.  Constitutional:      Appearance: Normal appearance. She is obese.  HENT:     Head: Normocephalic and atraumatic.     Mouth/Throat:     Mouth: Mucous membranes are moist.     Pharynx: Oropharynx is clear. No oropharyngeal exudate or posterior oropharyngeal erythema.  Eyes:     Conjunctiva/sclera: Conjunctivae normal.  Pulmonary:     Effort: Pulmonary effort is normal.  Abdominal:     Palpations: Abdomen is soft. There is no mass.     Tenderness: There is no abdominal tenderness. There is no rebound.     Comments: Soft without masses or tenderness, poor tone  Genitourinary:    General: Normal vulva.     Exam position: Lithotomy position.     Pubic Area: No rash or pubic lice.      Labia:        Right: No rash or lesion.  Left: No rash or lesion.      Vagina: Vaginal discharge (c/o increased d/c, grey creamy leukorrhea, ph<4.5) present. No erythema, bleeding or lesions.     Cervix: Normal.     Uterus: Normal.      Rectum: Normal.     Comments: pH = <4.5 Lymphadenopathy:     Head:     Right side of head: No preauricular or posterior auricular adenopathy.     Left side of head: No preauricular or posterior auricular adenopathy.     Cervical: No cervical adenopathy.     Right cervical: No superficial, deep  or posterior cervical adenopathy.    Left cervical: No superficial, deep or posterior cervical adenopathy.     Upper Body:     Right upper body: No supraclavicular, axillary or epitrochlear adenopathy.     Left upper body: No supraclavicular, axillary or epitrochlear adenopathy.     Lower Body: No right inguinal adenopathy. No left inguinal adenopathy.  Skin:    General: Skin is warm and dry.     Findings: No rash.  Neurological:     Mental Status: She is alert and oriented to person, place, and time.      Assessment and Plan:  Julia Nunez is a 52 y.o. female presenting to the [facility] Department for STI screening  1. Screening examination for venereal disease Treat wet mount per standing orders Immunization nurse consult  - WET PREP FOR [place], YEAST, CLUE - HIV [place] [facility] - Chlamydia/Gonorrhea [facility] - Syphilis Serology, [facility]  2. Morbid obesity ([facility]) 239 lbs   3. Diabetes mellitus without complication ([place]) dx'd [id number] [facility]   Patient accepted all screenings including vaginal CT/GC and bloodwork for HIV/RPR, and wet prep. Patient meets criteria for HepB screening? No. Ordered? no Patient meets criteria for HepC screening? Yes. Ordered? no  Treat wet prep per standing order Discussed time line for [facility] results and that patient will be called with positive results and encouraged patient to call if she had not heard in 2 weeks.  Counseled to return or seek care for continued or worsening symptoms Recommended repeat testing in 3 months with positive results. Recommended condom use with all sex  Patient is currently using Sterilization for Men and Women to prevent pregnancy.    Return if symptoms worsen or fail to improve.  No future appointments.  Herbie Saxon, CNM

## 2022-10-18 NOTE — Progress Notes (Signed)
Wet prep reviewed, no treatment indicated..Zahniya Zellars Brewer-Jensen, RN  

## 2023-04-14 ENCOUNTER — Other Ambulatory Visit: Payer: Self-pay | Admitting: Obstetrics and Gynecology

## 2023-04-14 DIAGNOSIS — Z1231 Encounter for screening mammogram for malignant neoplasm of breast: Secondary | ICD-10-CM

## 2023-05-09 ENCOUNTER — Ambulatory Visit
Admission: RE | Admit: 2023-05-09 | Discharge: 2023-05-09 | Disposition: A | Discharge status: Home / Self Care | Payer: Managed Care, Other (non HMO) | Source: Ambulatory Visit | Attending: Obstetrics and Gynecology | Admitting: Obstetrics and Gynecology

## 2023-05-09 DIAGNOSIS — Z1231 Encounter for screening mammogram for malignant neoplasm of breast: Secondary | ICD-10-CM | POA: Insufficient documentation

## 2024-05-10 ENCOUNTER — Other Ambulatory Visit: Payer: Self-pay | Admitting: Obstetrics and Gynecology

## 2024-05-10 DIAGNOSIS — Z1231 Encounter for screening mammogram for malignant neoplasm of breast: Secondary | ICD-10-CM

## 2024-05-11 ENCOUNTER — Other Ambulatory Visit: Payer: Self-pay | Admitting: Orthopedic Surgery

## 2024-05-11 DIAGNOSIS — M25561 Pain in right knee: Secondary | ICD-10-CM

## 2024-05-11 DIAGNOSIS — M2391 Unspecified internal derangement of right knee: Secondary | ICD-10-CM

## 2024-05-11 DIAGNOSIS — S83411A Sprain of medial collateral ligament of right knee, initial encounter: Secondary | ICD-10-CM

## 2024-05-16 ENCOUNTER — Encounter: Payer: Self-pay | Admitting: Gastroenterology

## 2024-05-16 NOTE — Anesthesia Preprocedure Evaluation (Addendum)
 Anesthesia Evaluation  Patient identified by MRN, date of birth, ID band Patient awake    Reviewed: Allergy & Precautions, H&P , NPO status , Patient's Chart, lab work & pertinent test results  History of Anesthesia Complications (+) PONV, Family history of anesthesia reaction and history of anesthetic complications  Airway Mallampati: I  TM Distance: >3 FB Neck ROM: Full    Dental no notable dental hx.    Pulmonary neg pulmonary ROS, former smoker   Pulmonary exam normal breath sounds clear to auscultation       Cardiovascular negative cardio ROS Normal cardiovascular exam Rhythm:Regular Rate:Normal     Neuro/Psych  Headaches  Anxiety     negative neurological ROS  negative psych ROS   GI/Hepatic negative GI ROS, Neg liver ROS,GERD  ,,  Endo/Other  negative endocrine ROSdiabetes    Renal/GU negative Renal ROS  negative genitourinary   Musculoskeletal negative musculoskeletal ROS (+)    Abdominal   Peds negative pediatric ROS (+)  Hematology negative hematology ROS (+)   Anesthesia Other Findings Last Mounjaro 05-13-24  PONV--will administer zofran  4 mg IV preop Anxiety  Back pain Complication of anesthesia  PONV (postoperative nausea and vomiting) Family history of adverse reaction to anesthesia  Headache GERD (gastroesophageal reflux disease)  Diabetes mellitus without complication (HCC) Wears contact lenses Migraine    Reproductive/Obstetrics negative OB ROS                              Anesthesia Physical Anesthesia Plan  ASA: 2  Anesthesia Plan: General   Post-op Pain Management:    Induction: Intravenous  PONV Risk Score and Plan:   Airway Management Planned: Natural Airway and Nasal Cannula  Additional Equipment:   Intra-op Plan:   Post-operative Plan:   Informed Consent: I have reviewed the patients History and Physical, chart, labs and discussed the  procedure including the risks, benefits and alternatives for the proposed anesthesia with the patient or authorized representative who has indicated his/her understanding and acceptance.     Dental Advisory Given  Plan Discussed with: Anesthesiologist, CRNA and Surgeon  Anesthesia Plan Comments: (Patient consented for risks of anesthesia including but not limited to:  - adverse reactions to medications - risk of airway placement if required - damage to eyes, teeth, lips or other oral mucosa - nerve damage due to positioning  - sore throat or hoarseness - Damage to heart, brain, nerves, lungs, other parts of body or loss of life  Patient voiced understanding and assent.)         Anesthesia Quick Evaluation

## 2024-05-21 ENCOUNTER — Inpatient Hospital Stay: Admission: RE | Admit: 2024-05-21 | Source: Ambulatory Visit

## 2024-05-22 ENCOUNTER — Encounter
Admission: RE | Disposition: A | Discharge status: Home / Self Care | Payer: Self-pay | Source: Home / Self Care | Attending: Gastroenterology

## 2024-05-22 ENCOUNTER — Ambulatory Visit
Admission: RE | Admit: 2024-05-22 | Discharge: 2024-05-22 | Disposition: A | Discharge status: Home / Self Care | Attending: Gastroenterology | Admitting: Gastroenterology

## 2024-05-22 ENCOUNTER — Encounter: Payer: Self-pay | Admitting: Gastroenterology

## 2024-05-22 ENCOUNTER — Other Ambulatory Visit: Payer: Self-pay

## 2024-05-22 ENCOUNTER — Ambulatory Visit: Payer: Self-pay | Admitting: Anesthesiology

## 2024-05-22 DIAGNOSIS — K317 Polyp of stomach and duodenum: Secondary | ICD-10-CM | POA: Insufficient documentation

## 2024-05-22 DIAGNOSIS — K219 Gastro-esophageal reflux disease without esophagitis: Secondary | ICD-10-CM | POA: Insufficient documentation

## 2024-05-22 DIAGNOSIS — G43909 Migraine, unspecified, not intractable, without status migrainosus: Secondary | ICD-10-CM | POA: Diagnosis not present

## 2024-05-22 DIAGNOSIS — F419 Anxiety disorder, unspecified: Secondary | ICD-10-CM | POA: Diagnosis not present

## 2024-05-22 DIAGNOSIS — K3189 Other diseases of stomach and duodenum: Secondary | ICD-10-CM | POA: Insufficient documentation

## 2024-05-22 DIAGNOSIS — K449 Diaphragmatic hernia without obstruction or gangrene: Secondary | ICD-10-CM | POA: Diagnosis not present

## 2024-05-22 DIAGNOSIS — Z7985 Long-term (current) use of injectable non-insulin antidiabetic drugs: Secondary | ICD-10-CM | POA: Insufficient documentation

## 2024-05-22 DIAGNOSIS — E119 Type 2 diabetes mellitus without complications: Secondary | ICD-10-CM | POA: Insufficient documentation

## 2024-05-22 DIAGNOSIS — Z8719 Personal history of other diseases of the digestive system: Secondary | ICD-10-CM | POA: Insufficient documentation

## 2024-05-22 DIAGNOSIS — R1314 Dysphagia, pharyngoesophageal phase: Secondary | ICD-10-CM | POA: Diagnosis present

## 2024-05-22 DIAGNOSIS — Z87891 Personal history of nicotine dependence: Secondary | ICD-10-CM | POA: Insufficient documentation

## 2024-05-22 HISTORY — DX: Migraine, unspecified, not intractable, without status migrainosus: G43.909

## 2024-05-22 LAB — GLUCOSE, CAPILLARY
Glucose-Capillary: 76 mg/dL (ref 70–99)
Glucose-Capillary: 77 mg/dL (ref 70–99)

## 2024-05-22 LAB — POCT PREGNANCY, URINE: Preg Test, Ur: NEGATIVE

## 2024-05-22 SURGERY — EGD (ESOPHAGOGASTRODUODENOSCOPY)
Anesthesia: General

## 2024-05-22 MED ORDER — STERILE WATER FOR IRRIGATION IR SOLN
Status: DC | PRN
  Administered 2024-05-22: 1

## 2024-05-22 MED ORDER — ONDANSETRON HCL 4 MG/2ML IJ SOLN
4.0000 mg | Freq: Once | INTRAMUSCULAR | Status: AC
Start: 2024-05-22 — End: 2024-05-22
  Administered 2024-05-22: 4 mg via INTRAVENOUS

## 2024-05-22 MED ORDER — GLYCOPYRROLATE 0.2 MG/ML IJ SOLN
INTRAMUSCULAR | Status: DC | PRN
  Administered 2024-05-22: .2 mg via INTRAVENOUS

## 2024-05-22 MED ORDER — LIDOCAINE HCL (CARDIAC) PF 100 MG/5ML IV SOSY
PREFILLED_SYRINGE | INTRAVENOUS | Status: DC | PRN
  Administered 2024-05-22: 50 mg via INTRAVENOUS

## 2024-05-22 MED ORDER — DEXTROSE 50 % IV SOLN
15.0000 mL | Freq: Once | INTRAVENOUS | Status: AC
  Administered 2024-05-22: 15 mL via INTRAVENOUS

## 2024-05-22 MED ORDER — PROPOFOL 10 MG/ML IV BOLUS
INTRAVENOUS | Status: DC | PRN
  Administered 2024-05-22: 60 mg via INTRAVENOUS
  Administered 2024-05-22 (×3): 30 mg via INTRAVENOUS

## 2024-05-22 MED ORDER — DEXTROSE 50 % IV SOLN
INTRAVENOUS | Status: AC
  Filled 2024-05-22: qty 50

## 2024-05-22 MED ORDER — ONDANSETRON HCL 4 MG/2ML IJ SOLN
INTRAMUSCULAR | Status: AC
  Filled 2024-05-22: qty 2

## 2024-05-22 MED ORDER — LACTATED RINGERS IV SOLN: INTRAVENOUS | Status: DC

## 2024-05-22 SURGICAL SUPPLY — 5 items
FORCEPS BIOP RAD 4 LRG CAP 4 (CUTTING FORCEPS) IMPLANT
GOWN CVR UNV OPN BCK APRN NK (MISCELLANEOUS) ×2 IMPLANT
KIT PRC NS LF DISP ENDO (KITS) ×1 IMPLANT
MANIFOLD NEPTUNE II (INSTRUMENTS) ×1 IMPLANT
WATER STERILE IRR 250ML POUR (IV SOLUTION) ×1 IMPLANT

## 2024-05-22 NOTE — Transfer of Care (Signed)
 Immediate Anesthesia Transfer of Care Note  Patient: Julia Nunez  Procedure(s) Performed: EGD (ESOPHAGOGASTRODUODENOSCOPY)  Patient Location: PACU  Anesthesia Type: General  Level of Consciousness: awake, alert  and patient cooperative  Airway and Oxygen Therapy: Patient Spontanous Breathing and Patient connected to supplemental oxygen  Post-op Assessment: Post-op Vital signs reviewed, Patient's Cardiovascular Status Stable, Respiratory Function Stable, Patent Airway and No signs of Nausea or vomiting  Post-op Vital Signs: Reviewed and stable  Complications: No notable events documented.

## 2024-05-22 NOTE — H&P (Signed)
 Julia JONELLE Brooklyn, MD [facility], DHIP [address]  [place], [place] [address]  Main: [phone] Fax:  [phone] Pager: [phone]   Primary Care Physician:  Jeffie Cheryl BRAVO, MD Primary Gastroenterologist:  Dr. Corinn JONELLE Nunez  Pre-Procedure History & Physical: HPI:  Julia Nunez is a 53 y.o. female is here for an endoscopy.   Past Medical History:  Diagnosis Date   Anxiety    Back pain    Complication of anesthesia    Diabetes mellitus without complication (HCC)    Family history of adverse reaction to anesthesia    DAD-NAUSEATED    GERD (gastroesophageal reflux disease)    Barrett's esophagus   Headache    H/O MIGRAINES   Migraine    PONV (postoperative nausea and vomiting)    NAUSEATED   Wears contact lenses     Past Surgical History:  Procedure Laterality Date   CARDIAC CATHETERIZATION     no stents   CESAREAN SECTION  1999   X1   COLONOSCOPY WITH PROPOFOL  N/A 03/27/2015   Procedure: COLONOSCOPY WITH PROPOFOL ;  Surgeon: Lamar ONEIDA Holmes, MD;  Location: [facility] ENDOSCOPY;  Service: Endoscopy;  Laterality: N/A;   COLONOSCOPY WITH PROPOFOL  N/A 03/10/2020   Procedure: COLONOSCOPY WITH PROPOFOL ;  Surgeon: Maryruth Ole ONEIDA, MD;  Location: [facility] ENDOSCOPY;  Service: Endoscopy;  Laterality: N/A;   CYSTECTOMY     SEBACEOUS CYST ON SCALP   CYSTOSCOPY     ESOPHAGOGASTRODUODENOSCOPY N/A 03/27/2015   Procedure: ESOPHAGOGASTRODUODENOSCOPY (EGD);  Surgeon: Lamar ONEIDA Holmes, MD;  Location: [facility] ENDOSCOPY;  Service: Endoscopy;  Laterality: N/A;   ESOPHAGOGASTRODUODENOSCOPY N/A 03/10/2020   Procedure: ESOPHAGOGASTRODUODENOSCOPY (EGD);  Surgeon: Maryruth Ole ONEIDA, MD;  Location: [facility] ENDOSCOPY;  Service: Endoscopy;  Laterality: N/A;   LAPAROSCOPIC BILATERAL SALPINGECTOMY Bilateral 05/24/2016   Procedure: LAPAROSCOPIC BILATERAL TUBAL BANDING VIA FALOPE RINGS;  Surgeon: Gladis DELENA Dollar, MD;  Location: [facility] ORS;  Service: Gynecology;   Laterality: Bilateral;   SAVORY DILATION N/A 03/27/2015   Procedure: SAVORY DILATION;  Surgeon: Lamar ONEIDA Holmes, MD;  Location[facility] ENDOSCOPY;  Service: Endoscopy;  Laterality: N/A;   TUBAL LIGATION      Prior to Admission medications   Medication Sig Start Date End Date Taking? Authorizing Provider  Ascorbic Acid (VITAMIN C) 1000 MG tablet Take 1,000 mg by mouth daily.   Yes [provider]  cefdinir (OMNICEF) 300 MG capsule Take 300 mg by mouth 2 (two) times daily.   Yes [provider]  citalopram (CELEXA) 10 MG tablet Take 10 mg by mouth daily.   Yes [provider]  magnesium oxide (MAG-OX) 400 (240 Mg) MG tablet Take 400 mg by mouth daily.   Yes [provider]  methylPREDNISolone (MEDROL DOSEPAK) 4 MG TBPK tablet Take 4 mg by mouth daily.   Yes [provider]  pantoprazole (PROTONIX) 40 MG tablet Take 40 mg by mouth daily.   Yes [provider]  pravastatin (PRAVACHOL) 10 MG tablet Take 10 mg by mouth every Monday, Wednesday, and Friday.   Yes [provider]  tirzepatide CLOYDE) 10 MG/0.5ML Pen Inject 15 mg into the skin once a week.   Yes [provider]  topiramate (TOPAMAX) 50 MG tablet Take 25 mg by mouth 2 (two) times daily.   Yes [provider]  valACYclovir (VALTREX) 1000 MG tablet Take 1,000 mg by mouth daily.   Yes [provider]  vitamin B-12 (CYANOCOBALAMIN) 1000 MCG tablet Take 2,000 mcg by mouth daily.  Yes [provider]  Vitamin D , Ergocalciferol , (DRISDOL ) 50000 units CAPS capsule Take 1 capsule (50,000 Units total) by mouth every 7 (seven) days. 09/15/16  Yes Shambley, Melody N, CNM  clobetasol  cream (TEMOVATE ) 0.05 % Apply 1 application topically 2 (two) times daily. Patient not taking: Reported on 10/18/2022 02/08/18   Shambley, Melody N, CNM  ofloxacin (FLOXIN) 0.3 % OTIC solution Place 5 drops into the right ear daily. Patient not taking: Reported on 05/22/2024     [provider]    Allergies as of 05/07/2024 - Review Complete 10/18/2022  Allergen Reaction Noted   Amoxicillin-pot clavulanate Diarrhea 07/01/2016   Hydrocodone -acetaminophen  Nausea Only 12/12/2014   Rosuvastatin Other (See Comments) 05/02/2019    Family History  Problem Relation Age of Onset   Hypertension Father    Diabetes Father    Breast cancer Neg Hx     Social History   Socioeconomic History   Marital status: Legally Separated    Spouse name: Not on file   Number of children: Not on file   Years of education: Not on file   Highest education level: Not on file  Occupational History   Not on file  Tobacco Use   Smoking status: Former    Current packs/day: 0.00    Average packs/day: 0.5 packs/day for 10.0 years (5.0 ttl pk-yrs)    Types: Cigarettes    Start date: 08/19/2001    Quit date: 08/20/2011    Years since quitting: 12.7   Smokeless tobacco: Never  Vaping Use   Vaping status: Never Used  Substance and Sexual Activity   Alcohol use: Not Currently    Comment: occaisionally   Drug use: No   Sexual activity: Yes    Birth control/protection: Surgical  Other Topics Concern   Not on file  Social History Narrative   Not on file   Social Drivers of Health   Financial Resource Strain: Low Risk  (05/01/2024)   Received from [facility]   Overall Financial Resource Strain (CARDIA)    Difficulty of Paying Living Expenses: Not hard at all  Food Insecurity: No Food Insecurity (05/01/2024)   Received from [facility]   Hunger Vital Sign    Within the past 12 months, you worried that your food would run out before you got the money to buy more.: Never true    Within the past 12 months, the food you bought just didn't last and you didn't have money to get more.: Never true  Transportation Needs: No Transportation Needs (05/01/2024)   Received from [facility]    In the  past 12 months, has lack of transportation kept you from medical appointments or from getting medications?: No    Lack of Transportation (Non-Medical): No  Physical Activity: Not on file  Stress: Not on file  Social Connections: Unknown (12/08/2021)   Received from [facility]   Social Network    Social Network: Not on file  Intimate Partner Violence: Unknown (10/30/2021)   Received from [facility]   HITS    Physically Hurt: Not on file    Insult or Talk Down To: Not on file    Threaten Physical Harm: Not on file    Scream or Curse: Not on file    Review of Systems: See HPI, otherwise negative ROS  Physical Exam: BP 111/73   Pulse 74   Temp (!) 97.4 F (36.3 C) (Temporal)  Resp 16   Ht 5' 7 (1.702 m)   Wt 78 kg   SpO2 98%   BMI 26.94 kg/m  General:   Alert,  pleasant and cooperative in NAD Head:  Normocephalic and atraumatic. Neck:  Supple; no masses or thyromegaly. Lungs:  Clear throughout to auscultation.    Heart:  Regular rate and rhythm. Abdomen:  Soft, nontender and nondistended. Normal bowel sounds, without guarding, and without rebound.   Neurologic:  Alert and  oriented x4;  grossly normal neurologically.  Impression/Plan: Addilyne Backs is here for an endoscopy to be performed for dysphagia, gerd. H/o barrett's esophagus  Risks, benefits, limitations, and alternatives regarding  endoscopy have been reviewed with the patient.  Questions have been answered.  All parties agreeable.   Julia Brooklyn, MD  05/22/2024, 10:52 AM

## 2024-05-22 NOTE — Op Note (Signed)
 [facility] Patient Name: Julia Nunez Procedure Date: 05/22/2024 11:46 AM MRN: [id number] Account #: [id number] Date of Birth: Nov 11, 1970 Admit Type: Outpatient Age: 53 Room: [facility] OR ROOM 01 Gender: Female Note Status: Finalized Instrument Name: Endoscope [id number] Procedure:             Upper GI endoscopy Indications:           Screening for Barrett's esophagus, Esophageal                         dysphagia, Follow-up of gastro-esophageal reflux                         disease Providers:             Corinn Jess Brooklyn MD, MD Referring MD:          Corinn Jess Brooklyn MD, MD (Referring MD), Cheryl CHARLENA Jericho (Referring MD) Medicines:             General Anesthesia Complications:         No immediate complications. Estimated blood loss: None. Procedure:             Pre-Anesthesia Assessment:                        - Prior to the procedure, a History and Physical was                         performed, and patient medications and allergies were                         reviewed. The patient is competent. The risks and                         benefits of the procedure and the sedation options and                         risks were discussed with the patient. All questions                         were answered and informed consent was obtained.                         Patient identification and proposed procedure were                         verified by the physician, the nurse, the                         anesthesiologist, the anesthetist and the technician                         in the pre-procedure area in the procedure room in the                         endoscopy suite. Mental Status Examination: alert and  oriented. Airway Examination: normal oropharyngeal                         airway and neck mobility. Respiratory Examination:                         clear to auscultation. CV Examination:  normal.                         Prophylactic Antibiotics: The patient does not require                         prophylactic antibiotics. Prior Anticoagulants: The                         patient has taken no anticoagulant or antiplatelet                         agents. ASA Grade Assessment: II - A patient with mild                         systemic disease. After reviewing the risks and                         benefits, the patient was deemed in satisfactory                         condition to undergo the procedure. The anesthesia                         plan was to use general anesthesia. Immediately prior                         to administration of medications, the patient was                         re-assessed for adequacy to receive sedatives. The                         heart rate, respiratory rate, oxygen saturations,                         blood pressure, adequacy of pulmonary ventilation, and                         response to care were monitored throughout the                         procedure. The physical status of the patient was                         re-assessed after the procedure.                        After obtaining informed consent, the endoscope was                         passed under direct vision. Throughout the procedure,  the patient's blood pressure, pulse, and oxygen                         saturations were monitored continuously. The Endoscope                         was introduced through the mouth, and advanced to the                         second part of duodenum. The upper GI endoscopy was                         accomplished without difficulty. The patient tolerated                         the procedure fairly well. Findings:      The duodenal bulb and second portion of the duodenum were normal.      Diffuse mildly erythematous mucosa without bleeding was found in the       entire examined stomach. Biopsies were taken with a  cold forceps for       histology.      A small hiatal hernia was present.      A few small sessile fundic gland polyps with no bleeding and no stigmata       of recent bleeding were found in the gastric fundus.      Esophagogastric landmarks were identified: the gastroesophageal junction       was found at 35 cm from the incisors.      The gastroesophageal junction and examined esophagus were normal, there       is no evidence of barrett's esophagus. Biopsies were taken with a cold       forceps for histology. Impression:            - Normal duodenal bulb and second portion of the                         duodenum.                        - Erythematous mucosa in the stomach. Biopsied.                        - Small hiatal hernia.                        - A few fundic gland polyps.                        - Esophagogastric landmarks identified.                        - Normal gastroesophageal junction and esophagus.                         Biopsied. Recommendation:        - Await pathology results.                        - Discharge patient to home (with escort).                        -  Resume previous diet today.                        - Continue present medications.                        - Follow an antireflux regimen. Procedure Code(s):     --- Professional ---                        [phone], Esophagogastroduodenoscopy, flexible,                         transoral; with biopsy, single or multiple Diagnosis Code(s):     --- Professional ---                        K31.89, Other diseases of stomach and duodenum                        K44.9, Diaphragmatic hernia without obstruction or                         gangrene                        K31.7, Polyp of stomach and duodenum                        Z13.810, Encounter for screening for upper                         gastrointestinal disorder                        R13.14, Dysphagia, pharyngoesophageal phase                        K21.9,  Gastro-esophageal reflux disease without                         esophagitis CPT copyright 2022 American Medical Association. All rights reserved. The codes documented in this report are preliminary and upon coder review may  be revised to meet current compliance requirements. Dr. Corinn Brooklyn Corinn Jess Brooklyn MD, MD 05/22/2024 87:91:55 PM This report has been signed electronically. Number of Addenda: 0 Note Initiated On: 05/22/2024 11:46 AM Estimated Blood Loss:  Estimated blood loss: none.      [facility]

## 2024-05-22 NOTE — Anesthesia Postprocedure Evaluation (Signed)
 Anesthesia Post Note  Patient: Julia Nunez  Procedure(s) Performed: EGD (ESOPHAGOGASTRODUODENOSCOPY)  Patient location during evaluation: PACU Anesthesia Type: General Level of consciousness: awake and alert Pain management: pain level controlled Vital Signs Assessment: post-procedure vital signs reviewed and stable Respiratory status: spontaneous breathing, nonlabored ventilation, respiratory function stable and patient connected to nasal cannula oxygen Cardiovascular status: blood pressure returned to baseline and stable Postop Assessment: no apparent nausea or vomiting Anesthetic complications: no   No notable events documented.   Last Vitals:  Vitals:   05/22/24 1224 05/22/24 1225  BP:  98/64  Pulse:    Resp: 18 11  Temp:  (!) 36.3 C  SpO2:  95%    Last Pain:  Vitals:   05/22/24 1225  TempSrc:   PainSc: 0-No pain                 Marlon Vonruden C Taneia Mealor

## 2024-05-24 LAB — SURGICAL PATHOLOGY

## 2024-05-25 ENCOUNTER — Ambulatory Visit: Payer: Self-pay | Admitting: Gastroenterology

## 2024-06-01 ENCOUNTER — Other Ambulatory Visit: Payer: Self-pay | Admitting: Gastroenterology

## 2024-06-01 DIAGNOSIS — R079 Chest pain, unspecified: Secondary | ICD-10-CM

## 2024-06-06 ENCOUNTER — Ambulatory Visit: Payer: Self-pay | Admitting: Gastroenterology

## 2024-06-06 ENCOUNTER — Ambulatory Visit
Admission: RE | Admit: 2024-06-06 | Discharge: 2024-06-06 | Disposition: A | Discharge status: Home / Self Care | Source: Ambulatory Visit | Attending: Gastroenterology | Admitting: Gastroenterology

## 2024-06-06 DIAGNOSIS — R079 Chest pain, unspecified: Secondary | ICD-10-CM | POA: Insufficient documentation

## 2024-06-06 LAB — POCT I-STAT CREATININE: Creatinine, Ser: 0.9 mg/dL (ref 0.44–1.00)

## 2024-06-06 MED ORDER — IOHEXOL 300 MG/ML  SOLN
75.0000 mL | Freq: Once | INTRAMUSCULAR | Status: AC | PRN
  Administered 2024-06-06: 75 mL via INTRAVENOUS

## 2024-06-06 NOTE — Progress Notes (Signed)
 Discussed CT scan findings with patient as well as his primary cardiologist, Dr. Wilburn.  He said he will be following up with her on Friday this week in his office to discuss about cardiac CT scan.  Informed patient about the same  RV

## 2024-06-07 ENCOUNTER — Other Ambulatory Visit: Payer: Self-pay

## 2024-06-07 MED ORDER — IVABRADINE HCL 5 MG PO TABS
15.0000 mg | ORAL_TABLET | Freq: Once | ORAL | 0 refills | Status: DC
  Filled 2024-06-07: qty 3, 1d supply, fill #0

## 2024-06-13 ENCOUNTER — Ambulatory Visit
Admission: RE | Admit: 2024-06-13 | Discharge: 2024-06-13 | Disposition: A | Discharge status: Home / Self Care | Source: Ambulatory Visit | Attending: Obstetrics and Gynecology | Admitting: Obstetrics and Gynecology

## 2024-06-13 DIAGNOSIS — Z1231 Encounter for screening mammogram for malignant neoplasm of breast: Secondary | ICD-10-CM | POA: Insufficient documentation

## 2024-06-18 ENCOUNTER — Other Ambulatory Visit: Payer: Self-pay | Admitting: Otolaryngology

## 2024-06-18 DIAGNOSIS — H9201 Otalgia, right ear: Secondary | ICD-10-CM

## 2024-06-19 ENCOUNTER — Ambulatory Visit
Admission: RE | Admit: 2024-06-19 | Discharge: 2024-06-19 | Disposition: A | Discharge status: Home / Self Care | Source: Ambulatory Visit | Attending: Otolaryngology | Admitting: Otolaryngology

## 2024-06-19 DIAGNOSIS — H9201 Otalgia, right ear: Secondary | ICD-10-CM
